# Patient Record
Sex: Male | Born: 1980
Health system: Southern US, Community
[De-identification: ages and names within clinical notes are randomized; demographics above are authoritative.]

## PROBLEM LIST (undated history)

## (undated) DIAGNOSIS — K219 Gastro-esophageal reflux disease without esophagitis: Secondary | ICD-10-CM

## (undated) DIAGNOSIS — K900 Celiac disease: Secondary | ICD-10-CM

## (undated) DIAGNOSIS — T7840XA Allergy, unspecified, initial encounter: Secondary | ICD-10-CM

## (undated) HISTORY — DX: Gastro-esophageal reflux disease without esophagitis: K21.9

## (undated) HISTORY — DX: Celiac disease: K90.0

## (undated) HISTORY — PX: APPENDECTOMY: SHX54

## (undated) HISTORY — DX: Allergy, unspecified, initial encounter: T78.40XA

---
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
## 2009-06-26 HISTORY — PX: HERNIA REPAIR: SHX51

## 2015-07-06 ENCOUNTER — Encounter: Payer: Self-pay | Admitting: Family Medicine

## 2015-07-06 ENCOUNTER — Ambulatory Visit (INDEPENDENT_AMBULATORY_CARE_PROVIDER_SITE_OTHER): Payer: 59 | Admitting: Family Medicine

## 2015-07-06 VITALS — BP 123/80 | HR 60 | Temp 97.1°F | Ht 71.0 in | Wt 200.0 lb

## 2015-07-06 DIAGNOSIS — R109 Unspecified abdominal pain: Secondary | ICD-10-CM

## 2015-07-06 DIAGNOSIS — N10 Acute tubulo-interstitial nephritis: Secondary | ICD-10-CM

## 2015-07-06 LAB — POCT URINALYSIS DIPSTICK
BILIRUBIN UA: NEGATIVE
Glucose, UA: NEGATIVE
KETONES UA: NEGATIVE
Nitrite, UA: NEGATIVE
PROTEIN UA: NEGATIVE
Spec Grav, UA: 1.025
Urobilinogen, UA: NEGATIVE
pH, UA: 5

## 2015-07-06 LAB — POCT UA - MICROSCOPIC ONLY
BACTERIA, U MICROSCOPIC: NEGATIVE
CASTS, UR, LPF, POC: NEGATIVE
Crystals, Ur, HPF, POC: NEGATIVE
Yeast, UA: NEGATIVE

## 2015-07-06 MED ORDER — CEFTRIAXONE SODIUM 1 G IJ SOLR
1.0000 g | Freq: Once | INTRAMUSCULAR | Status: AC
  Administered 2015-07-06: 1 g via INTRAMUSCULAR

## 2015-07-06 MED ORDER — CIPROFLOXACIN HCL 500 MG PO TABS: 500.0000 mg | ORAL_TABLET | Freq: Two times a day (BID) | ORAL | Status: DC

## 2015-07-06 NOTE — Progress Notes (Signed)
   HPI  Patient presents today here to establish care and discuss abdominal pain.  He is a Oncologist in my practice.  He has a known history of celiac disease. For last 4 days he's continued to have his usual epigastric pain, however he's developed much more severe radiation of the pain to his bilateral flanks and down into his groin.  He denies dysuria, or any reason that he can think of that he would have a UTI. He has a family history of PKD, his father has it.  States he has a colicky sharp flank pain radiating to the groin.  Denies diarrhea, hematochezia, alcohol use, smoking, and Tylenol use.  He's been avoiding Motrin due to concerns that this could possibly be PUD  PMH: Smoking status noted His past medical, surgical, social, family history reviewed and updated in EMR ROS: Per HPI  Objective: BP 123/80 mmHg  Pulse 60  Temp(Src) 97.1 F (36.2 C) (Oral)  Ht '5\' 11"'$  (1.803 m)  Wt 200 lb (90.719 kg)  BMI 27.91 kg/m2 Gen: NAD, alert, cooperative with exam HEENT: NCAT, TMs normal bilaterally, oropharynx clear, nares clear with mild erythema CV: RRR, good S1/S2, no murmur Resp: CTABL, no wheezes, non-labored Abd: Normal bowel sounds, diffuse mild tenderness to palpation, CVA soreness with tapping but no reproduction of symptoms on the left Ext: No edema, warm Neuro: Alert and oriented, No gross deficits  Urinalysis with moderate blood and moderate leukocyte esterase, 15-20 WBCs per high-power field, 1-5 RBCs per high-power field   Assessment and plan:  # UTI, acute pyelonephritis Considering his back pain and severe systemic symptoms although ahead and treat this pyelonephritis 1 g of Rocephin +500 mg of Cipro twice daily 7 days Discussed possibility of prostatitis and he will follow-up if he has resumption of symptoms after medication is finished. Also discussed concerning family history of PKD, low threshold for ultrasound. Consider follow up UA in 3-4 weeks  to ensure resolution of hematuria  Orders Placed This Encounter  Procedures  . Urine culture  . CBC with Differential  . CMP14+EGFR  . Lipase  . POCT urinalysis dipstick  . POCT UA - Microscopic Only    Meds ordered this encounter  Medications  . omeprazole (PRILOSEC) 20 MG capsule    Sig: Take 20 mg by mouth 2 (two) times daily.  . ranitidine (ZANTAC) 150 MG tablet    Sig: Take 150 mg by mouth 2 (two) times daily.  . ciprofloxacin (CIPRO) 500 MG tablet    Sig: Take 1 tablet (500 mg total) by mouth 2 (two) times daily.    Dispense:  14 tablet    Refill:  0    Laroy Apple, MD [facility] 07/06/2015, 3:42 PM

## 2015-07-07 LAB — CBC WITH DIFFERENTIAL/PLATELET
BASOS: 0 %
Basophils Absolute: 0.1 10*3/uL (ref 0.0–0.2)
EOS (ABSOLUTE): 0.3 10*3/uL (ref 0.0–0.4)
EOS: 3 %
HEMATOCRIT: 37.2 % — AB (ref 37.5–51.0)
HEMOGLOBIN: 12.8 g/dL (ref 12.6–17.7)
IMMATURE GRANULOCYTES: 0 %
Immature Grans (Abs): 0 10*3/uL (ref 0.0–0.1)
LYMPHS ABS: 2.3 10*3/uL (ref 0.7–3.1)
LYMPHS: 21 %
MCH: 30.6 pg (ref 26.6–33.0)
MCHC: 34.4 g/dL (ref 31.5–35.7)
MCV: 89 fL (ref 79–97)
MONOCYTES: 8 %
MONOS ABS: 0.9 10*3/uL (ref 0.1–0.9)
Neutrophils Absolute: 7.7 10*3/uL — ABNORMAL HIGH (ref 1.4–7.0)
Neutrophils: 68 %
Platelets: 403 10*3/uL — ABNORMAL HIGH (ref 150–379)
RBC: 4.18 x10E6/uL (ref 4.14–5.80)
RDW: 12.7 % (ref 12.3–15.4)
WBC: 11.4 10*3/uL — AB (ref 3.4–10.8)

## 2015-07-07 LAB — CMP14+EGFR
A/G RATIO: 1.2 (ref 1.1–2.5)
ALBUMIN: 4.2 g/dL (ref 3.5–5.5)
ALT: 27 IU/L (ref 0–44)
AST: 17 IU/L (ref 0–40)
Alkaline Phosphatase: 75 IU/L (ref 39–117)
BUN/Creatinine Ratio: 13 (ref 8–19)
BUN: 12 mg/dL (ref 6–20)
Bilirubin Total: 0.3 mg/dL (ref 0.0–1.2)
CALCIUM: 9.3 mg/dL (ref 8.7–10.2)
CO2: 26 mmol/L (ref 18–29)
CREATININE: 0.93 mg/dL (ref 0.76–1.27)
Chloride: 99 mmol/L (ref 96–106)
GFR calc Af Amer: 125 mL/min/{1.73_m2} (ref >59)
GFR, EST NON AFRICAN AMERICAN: 108 mL/min/{1.73_m2} (ref >59)
GLOBULIN, TOTAL: 3.4 g/dL (ref 1.5–4.5)
Glucose: 95 mg/dL (ref 65–99)
Potassium: 4 mmol/L (ref 3.5–5.2)
Sodium: 141 mmol/L (ref 134–144)
Total Protein: 7.6 g/dL (ref 6.0–8.5)

## 2015-07-07 LAB — LIPASE: LIPASE: 35 U/L (ref 0–59)

## 2015-07-08 LAB — URINE CULTURE

## 2015-08-03 ENCOUNTER — Encounter: Payer: Self-pay | Admitting: Family Medicine

## 2015-08-03 ENCOUNTER — Ambulatory Visit (INDEPENDENT_AMBULATORY_CARE_PROVIDER_SITE_OTHER): Payer: 59 | Admitting: Family Medicine

## 2015-08-03 VITALS — BP 108/70 | HR 52 | Temp 97.1°F | Ht 71.0 in | Wt 194.8 lb

## 2015-08-03 DIAGNOSIS — J029 Acute pharyngitis, unspecified: Secondary | ICD-10-CM

## 2015-08-03 DIAGNOSIS — N390 Urinary tract infection, site not specified: Secondary | ICD-10-CM

## 2015-08-03 DIAGNOSIS — Z8271 Family history of polycystic kidney: Secondary | ICD-10-CM | POA: Insufficient documentation

## 2015-08-03 LAB — POCT UA - MICROSCOPIC ONLY
BACTERIA, U MICROSCOPIC: NEGATIVE
CASTS, UR, LPF, POC: NEGATIVE
CRYSTALS, UR, HPF, POC: NEGATIVE
Epithelial cells, urine per micros: NEGATIVE
RBC, urine, microscopic: NEGATIVE
WBC, Ur, HPF, POC: NEGATIVE
YEAST UA: NEGATIVE

## 2015-08-03 LAB — POCT URINALYSIS DIPSTICK
Blood, UA: NEGATIVE
GLUCOSE UA: NEGATIVE
LEUKOCYTES UA: NEGATIVE
NITRITE UA: NEGATIVE
Spec Grav, UA: >=1.03
Urobilinogen, UA: NEGATIVE
pH, UA: 6

## 2015-08-03 LAB — POCT RAPID STREP A (OFFICE): Rapid Strep A Screen: NEGATIVE

## 2015-08-03 NOTE — Progress Notes (Signed)
   HPI  Patient presents today here for follow-up of urinary tract infection.  UTI Patient has had some intermittent slight return of symptoms with occasional flank pain and occasional groin pain. This is consistently and occasionally he has similar pain with ceease.  He is concerned as his father has a history of polycystic kidney disease, he is not sure if its autosomal dominant No fevers, chills, sweats, abdominal pain, or discolored urine. He did have dysuria towards the end of his course of ciprofloxacin but this has resolved.  Sore throat. One day of sore throat, no cough or malaise.   PMH: Smoking status noted ROS: Per HPI  Objective: BP 108/70 mmHg  Pulse 52  Temp(Src) 97.1 F (36.2 C) (Oral)  Ht  (1.803 m)  Wt 194 lb 12.8 oz (88.361 kg)  BMI 27.18 kg/m2 Gen: NAD, alert, cooperative with exam HEENT: NCAT, slight tonsillar hypertrophy, mild erythema, no exudates, TMs normal bilaterally CV: RRR, good S1/S2, no murmur Resp: CTABL, no wheezes, non-labored Abd: SNTND, BS present, no guarding or organomegaly, no CVA tenderness Neuro: Alert and oriented, No gross deficits  Assessment and plan:  # urinary tract infection Urinalysis is reassuring today Considering family history of polycystic kidney disease will proceed ultrasound of the kidneys  # Sore throat Rapid strep neg Culture pending   Orders Placed This Encounter  Procedures  . Culture, Group A Strep    Order Specific Question:  Source    Answer:  throat  . US Renal    Standing Status: Future     Number of Occurrences:      Standing Expiration Date: 09/30/2016    Order Specific Question:  Reason for Exam (SYMPTOM  OR DIAGNOSIS REQUIRED)    Answer:  unexplained UTI, family Hx of  PKD    Order Specific Question:  Preferred imaging location?    Answer:  [facility]  . POCT urinalysis dipstick  . POCT UA - Microscopic Only  . POCT rapid strep A     Murtis Sink, MD [facility] 08/03/2015, 2:48 PM

## 2015-08-05 LAB — CULTURE, GROUP A STREP: Strep A Culture: NEGATIVE

## 2015-08-10 ENCOUNTER — Ambulatory Visit
Admission: RE | Admit: 2015-08-10 | Discharge: 2015-08-10 | Disposition: A | Discharge status: Home / Self Care | Payer: 59 | Source: Ambulatory Visit | Attending: Family Medicine | Admitting: Family Medicine

## 2015-08-10 DIAGNOSIS — N390 Urinary tract infection, site not specified: Secondary | ICD-10-CM

## 2015-08-10 DIAGNOSIS — Z8271 Family history of polycystic kidney: Secondary | ICD-10-CM

## 2015-09-27 ENCOUNTER — Encounter: Payer: Self-pay | Admitting: *Deleted

## 2015-09-27 ENCOUNTER — Encounter (INDEPENDENT_AMBULATORY_CARE_PROVIDER_SITE_OTHER): Payer: Self-pay

## 2015-11-18 ENCOUNTER — Other Ambulatory Visit: Payer: Self-pay | Admitting: Family

## 2015-11-18 MED ORDER — SUCRALFATE 1 GM/10ML PO SUSP: 1.0000 g | Freq: Three times a day (TID) | ORAL | Status: DC

## 2015-12-09 ENCOUNTER — Ambulatory Visit (INDEPENDENT_AMBULATORY_CARE_PROVIDER_SITE_OTHER): Payer: 59 | Admitting: Family Medicine

## 2015-12-09 ENCOUNTER — Encounter: Payer: Self-pay | Admitting: Family Medicine

## 2015-12-09 VITALS — BP 105/63 | HR 47 | Temp 97.4°F | Ht 71.0 in | Wt 189.0 lb

## 2015-12-09 DIAGNOSIS — K900 Celiac disease: Secondary | ICD-10-CM

## 2015-12-09 DIAGNOSIS — Z0000 Encounter for general adult medical examination without abnormal findings: Secondary | ICD-10-CM | POA: Diagnosis not present

## 2015-12-09 NOTE — Patient Instructions (Signed)
Continue current medications. Continue good therapeutic lifestyle changes which include good diet and exercise. Fall precautions discussed with patient. If an FOBT was given today- please return it to our front desk. If you are over 33 years old - you may need Prevnar 13 or the adult Pneumonia vaccine.   After your visit with us today you will receive a survey in the mail or online from [employer] regarding your care with us. Please take a moment to fill this out. Your feedback is very important to us as you can help us better understand your patient needs as well as improve your experience and satisfaction. WE CARE about you!

## 2015-12-09 NOTE — Progress Notes (Signed)
   HPI  Patient presents today here for a physical exam.  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  Patient feels well and is in good health.  He has celiac disease and had a recent dietary indiscretion resulting in diarrhea and dyspepsia. He has also recently had an elevated PSA, we have plans to repeat the labs in 3 months after collection, about 2 months from now.  He denies any nocturia, urinary hesitancy, or other symptoms of BPH.  He exercises playing soccer, seasonally. He watches his diet closely, gluten-free diets tend to be lower in carbohydrates and sugars  PMH: Smoking status noted Family history positive for depression and Arnold-Chiari malformation and sister Mother with thyroid cancer Status post appendectomy and hernia repair Never smoker  ROS: Per HPI  Objective: BP 105/63 mmHg  Pulse 47  Temp(Src) 97.4 F (36.3 C) (Oral)  Ht 5\' 11"  (1.803 m)  Wt 189 lb (85.73 kg)  BMI 26.37 kg/m2 Gen: NAD, alert, cooperative with exam HEENT: NCAT, EOMI, PERRL CV: RRR, good S1/S2, no murmur Resp: CTABL, no wheezes, non-labored Abd: SNTND, BS present, no guarding or organomegaly Ext: No edema, warm Neuro: Alert and oriented, No gross deficits  Assessment and plan:  # Annual physical exam Normal exam Discussed age-appropriate diet exercise recommendations  # Elevated PSA Plan repeat labs in about 2 months.  # Celiac disease Patient doing well with diet    Murtis SinkSam Everet Flagg, MD [facility] 12/09/2015, 5:08 PM

## 2016-02-15 ENCOUNTER — Other Ambulatory Visit: Payer: Self-pay | Admitting: Family Medicine

## 2016-02-15 ENCOUNTER — Ambulatory Visit (INDEPENDENT_AMBULATORY_CARE_PROVIDER_SITE_OTHER): Payer: 59

## 2016-02-15 DIAGNOSIS — M79671 Pain in right foot: Secondary | ICD-10-CM

## 2016-02-15 NOTE — Progress Notes (Signed)
R foot injury with  persistent pain for 2 weeks, exacerbated by another injury 2-3 days ago. X-ray ordered.  Murtis SinkSam Dim Meisinger, MD [facility] 02/15/2016, 7:50 AM

## 2016-03-21 ENCOUNTER — Other Ambulatory Visit: Payer: Self-pay | Admitting: Family Medicine

## 2016-03-21 MED ORDER — ALBUTEROL SULFATE HFA 108 (90 BASE) MCG/ACT IN AERS: 2.0000 | INHALATION_SPRAY | Freq: Four times a day (QID) | RESPIRATORY_TRACT | 1 refills | Status: DC | PRN

## 2016-03-21 NOTE — Telephone Encounter (Signed)
Pt with cough and wheezing, concern for bronchitis.   Albuterol sent in.   Murtis SinkSam Bradshaw, MD [facility] 03/21/2016, 12:53 PM

## 2016-04-05 ENCOUNTER — Other Ambulatory Visit: Payer: Self-pay

## 2016-04-05 MED ORDER — PREDNISONE 10 MG PO TABS: 10.0000 mg | ORAL_TABLET | ORAL | 0 refills | Status: AC

## 2016-04-05 MED ORDER — AZITHROMYCIN 250 MG PO TABS: ORAL_TABLET | ORAL | 0 refills | Status: DC

## 2016-04-18 DIAGNOSIS — Z3009 Encounter for other general counseling and advice on contraception: Secondary | ICD-10-CM | POA: Diagnosis not present

## 2016-04-27 DIAGNOSIS — Z302 Encounter for sterilization: Secondary | ICD-10-CM | POA: Diagnosis not present

## 2016-06-14 ENCOUNTER — Other Ambulatory Visit: Payer: Self-pay | Admitting: Family

## 2016-06-14 MED ORDER — OSELTAMIVIR PHOSPHATE 75 MG PO CAPS: 75.0000 mg | ORAL_CAPSULE | Freq: Every day | ORAL | 0 refills | Status: DC

## 2016-06-26 HISTORY — PX: VASECTOMY: SHX75

## 2017-05-31 ENCOUNTER — Other Ambulatory Visit: Payer: Self-pay | Admitting: Family

## 2017-05-31 MED ORDER — AMOXICILLIN 500 MG PO TABS: 500.0000 mg | ORAL_TABLET | Freq: Two times a day (BID) | ORAL | 0 refills | Status: DC

## 2017-11-26 ENCOUNTER — Encounter: Payer: Self-pay | Admitting: Family Medicine

## 2017-11-26 ENCOUNTER — Ambulatory Visit (INDEPENDENT_AMBULATORY_CARE_PROVIDER_SITE_OTHER): Payer: 59 | Admitting: Family Medicine

## 2017-11-26 VITALS — BP 121/76 | HR 51 | Temp 97.1°F | Ht 71.0 in | Wt 199.8 lb

## 2017-11-26 DIAGNOSIS — Z0000 Encounter for general adult medical examination without abnormal findings: Secondary | ICD-10-CM

## 2017-11-26 NOTE — Patient Instructions (Signed)
Great to see you!  I can recommend Tana ConchStephen Hunter or Jacquiline Doealeb Parker as very trustworthy PCPs   Health Maintenance, Male A healthy lifestyle and preventive care is important for your health and wellness. Ask your health care provider about what schedule of regular examinations is right for you. What should I know about weight and diet? Eat a Healthy Diet  Eat plenty of vegetables, fruits, whole grains, low-fat dairy products, and lean protein.  Do not eat a lot of foods high in solid fats, added sugars, or salt.  Maintain a Healthy Weight Regular exercise can help you achieve or maintain a healthy weight. You should:  Do at least 150 minutes of exercise each week. The exercise should increase your heart rate and make you sweat (moderate-intensity exercise).  Do strength-training exercises at least twice a week.  Watch Your Levels of Cholesterol and Blood Lipids  Have your blood tested for lipids and cholesterol every 5 years starting at 35 years of age. If you are at high risk for heart disease, you should start having your blood tested when you are 35 years old. You may need to have your cholesterol levels checked more often if: ? Your lipid or cholesterol levels are high. ? You are older than 35 years of age. ? You are at high risk for heart disease.  What should I know about cancer screening? Many types of cancers can be detected early and may often be prevented. Lung Cancer  You should be screened every year for lung cancer if: ? You are a current smoker who has smoked for at least 30 years. ? You are a former smoker who has quit within the past 15 years.  Talk to your health care provider about your screening options, when you should start screening, and how often you should be screened.  Colorectal Cancer  Routine colorectal cancer screening usually begins at 35 years of age and should be repeated every 5-10 years until you are 35 years old. You may need to be screened more  often if early forms of precancerous polyps or small growths are found. Your health care provider may recommend screening at an earlier age if you have risk factors for colon cancer.  Your health care provider may recommend using home test kits to check for hidden blood in the stool.  A small camera at the end of a tube can be used to examine your colon (sigmoidoscopy or colonoscopy). This checks for the earliest forms of colorectal cancer.  Prostate and Testicular Cancer  Depending on your age and overall health, your health care provider may do certain tests to screen for prostate and testicular cancer.  Talk to your health care provider about any symptoms or concerns you have about testicular or prostate cancer.  Skin Cancer  Check your skin from head to toe regularly.  Tell your health care provider about any new moles or changes in moles, especially if: ? There is a change in a mole's size, shape, or color. ? You have a mole that is larger than a pencil eraser.  Always use sunscreen. Apply sunscreen liberally and repeat throughout the day.  Protect yourself by wearing long sleeves, pants, a wide-brimmed hat, and sunglasses when outside.  What should I know about heart disease, diabetes, and high blood pressure?  If you are 6918-35 years of age, have your blood pressure checked every 3-5 years. If you are 35 years of age or older, have your blood pressure checked every year.  You should have your blood pressure measured twice-once when you are at a hospital or clinic, and once when you are not at a hospital or clinic. Record the average of the two measurements. To check your blood pressure when you are not at a hospital or clinic, you can use: ? An automated blood pressure machine at a pharmacy. ? A home blood pressure monitor.  Talk to your health care provider about your target blood pressure.  If you are between 10-37 years old, ask your health care provider if you should take  aspirin to prevent heart disease.  Have regular diabetes screenings by checking your fasting blood sugar level. ? If you are at a normal weight and have a low risk for diabetes, have this test once every three years after the age of 58. ? If you are overweight and have a high risk for diabetes, consider being tested at a younger age or more often.  A one-time screening for abdominal aortic aneurysm (AAA) by ultrasound is recommended for men aged 74-75 years who are current or former smokers. What should I know about preventing infection? Hepatitis B If you have a higher risk for hepatitis B, you should be screened for this virus. Talk with your health care provider to find out if you are at risk for hepatitis B infection. Hepatitis C Blood testing is recommended for:  Everyone born from [birth year] through 1963.  Anyone with known risk factors for hepatitis C.  Sexually Transmitted Diseases (STDs)  You should be screened each year for STDs including gonorrhea and chlamydia if: ? You are sexually active and are younger than 35 years of age. ? You are older than 35 years of age and your health care provider tells you that you are at risk for this type of infection. ? Your sexual activity has changed since you were last screened and you are at an increased risk for chlamydia or gonorrhea. Ask your health care provider if you are at risk.  Talk with your health care provider about whether you are at high risk of being infected with HIV. Your health care provider may recommend a prescription medicine to help prevent HIV infection.  What else can I do?  Schedule regular health, dental, and eye exams.  Stay current with your vaccines (immunizations).  Do not use any tobacco products, such as cigarettes, chewing tobacco, and e-cigarettes. If you need help quitting, ask your health care provider.  Limit alcohol intake to no more than 2 drinks per day. One drink equals 12 ounces of beer, 5 ounces of  wine, or 1 ounces of hard liquor.  Do not use street drugs.  Do not share needles.  Ask your health care provider for help if you need support or information about quitting drugs.  Tell your health care provider if you often feel depressed.  Tell your health care provider if you have ever been abused or do not feel safe at home. This information is not intended to replace advice given to you by your health care provider. Make sure you discuss any questions you have with your health care provider. Document Released: 12/09/2007 Document Revised: 02/09/2016 Document Reviewed: 03/16/2015 Elsevier Interactive Patient Education  [employer].

## 2017-11-26 NOTE — Progress Notes (Signed)
   HPI  Patient presents today for annual physical exam.  Patient feels well and has no complaints.  He exercises regularly playing soccer and being active around his house.  He is watching his diet carefully, he is gluten sensitive and has celiac disease which naturally leads to some dietary restriction.  He is recently had labs which looked good on review.  Including normal glucose, kidney function, liver enzymes, PSA, and lipid panel.  He had a very low triglyceride at 35  PMH: Smoking status noted ROS: Per HPI  Objective: BP 121/76   Pulse (!) 51   Temp (!) 97.1 F (36.2 C) (Oral)   Ht 5\' 11"  (1.803 m)   Wt 199 lb 12.8 oz (90.6 kg)   BMI 27.87 kg/m  Gen: NAD, alert, cooperative with exam HEENT: NCAT, EOMI, PERRL CV: RRR, good S1/S2, no murmur Resp: CTABL, no wheezes, non-labored Abd: SNTND, BS present, no guarding or organomegaly Ext: No edema, warm Neuro: Alert and oriented, No gross deficits  Assessment and plan:  #Annual physical exam Normal exam, no concerns Labs up-to-date     Murtis SinkSam Rosa Gambale, MD [facility] 11/26/2017, 2:38 PM

## 2018-08-06 ENCOUNTER — Ambulatory Visit: Payer: 59 | Admitting: Family Medicine

## 2018-08-06 ENCOUNTER — Encounter: Payer: Self-pay | Admitting: Family Medicine

## 2018-08-06 VITALS — BP 120/74 | HR 75 | Temp 97.9°F | Ht 71.0 in | Wt 191.8 lb

## 2018-08-06 DIAGNOSIS — Z6826 Body mass index (BMI) 26.0-26.9, adult: Secondary | ICD-10-CM

## 2018-08-06 DIAGNOSIS — Z0001 Encounter for general adult medical examination with abnormal findings: Secondary | ICD-10-CM

## 2018-08-06 DIAGNOSIS — K219 Gastro-esophageal reflux disease without esophagitis: Secondary | ICD-10-CM | POA: Diagnosis not present

## 2018-08-06 DIAGNOSIS — K900 Celiac disease: Secondary | ICD-10-CM

## 2018-08-06 DIAGNOSIS — J302 Other seasonal allergic rhinitis: Secondary | ICD-10-CM | POA: Diagnosis not present

## 2018-08-06 NOTE — Progress Notes (Signed)
Subjective:  Dean Cross is a 38 y.o. male who presents today for his annual comprehensive physical exam.    HPI:  He has no acute complaints today.   His stable, chronic medical conditions are outlined below:  # Seasonal Allergies - On zyrtec and flonase as needed and doing well  # GERD - On zantac as needed  # Celiac Disease - Diet controlled  Lifestyle Diet: Gluten free.  Exercise: Goes to gym and swims twice weekly. Likes soccer and basketball.   Depression screen PHQ 2/9 08/06/2018  Decreased Interest 0  Down, Depressed, Hopeless 0  PHQ - 2 Score 0    Health Maintenance Due  Topic Date Due  . HIV Screening  07/29/1997  . TETANUS/TDAP  07/29/2001     ROS: Per HPI, otherwise a complete review of systems was negative.   PMH:  The following were reviewed and entered/updated in epic: Past Medical History:  Diagnosis Date  . Allergy    celiac's and seasonal  . Celiac disease   . GERD (gastroesophageal reflux disease)    Patient Active Problem List   Diagnosis Date Noted  . GERD (gastroesophageal reflux disease) 08/06/2018  . Seasonal allergies 08/06/2018  . Celiac disease 12/09/2015  . Family history of polycystic kidney disease 08/03/2015   Past Surgical History:  Procedure Laterality Date  . APPENDECTOMY     1992  . FINGER AMPUTATION  2000  . HERNIA REPAIR  2011   b/l inguinal hernia   . VASECTOMY  2018    Family History  Problem Relation Age of Onset  . Cancer Mother        thyroid  . Hyperlipidemia Father   . Polycystic kidney disease Father   . Asthma Sister   . Depression Sister   . Celiac disease Sister   . Arthritis Sister   . Arnold-Chiari malformation Sister   . Depression Sister   . Heart disease Paternal Grandfather   . Hyperlipidemia Paternal Grandfather   . Lung cancer Maternal Grandmother   . Prostate cancer Neg Hx   . Colon cancer Neg Hx     Medications- reviewed and updated Current Outpatient Medications    Medication Sig Dispense Refill  . cetirizine (ZYRTEC) 10 MG tablet Take 10 mg by mouth daily.    . fluticasone (FLONASE) 50 MCG/ACT nasal spray Place into both nostrils daily.    . ranitidine (ZANTAC) 150 MG tablet Take 150 mg by mouth 2 (two) times daily.     No current facility-administered medications for this visit.     Allergies-reviewed and updated No Known Allergies  Social History   Socioeconomic History  . Marital status: Married    Spouse name: Not on file  . Number of children: Not on file  . Years of education: Not on file  . Highest education level: Not on file  Occupational History  . Not on file  Social Needs  . Financial resource strain: Not on file  . Food insecurity:    Worry: Not on file    Inability: Not on file  . Transportation needs:    Medical: Not on file    Non-medical: Not on file  Tobacco Use  . Smoking status: Never Smoker  . Smokeless tobacco: Never Used  Substance and Sexual Activity  . Alcohol use: No  . Drug use: No  . Sexual activity: Yes  Lifestyle  . Physical activity:    Days per week: Not on file    Minutes per  session: Not on file  . Stress: Not on file  Relationships  . Social connections:    Talks on phone: Not on file    Gets together: Not on file    Attends religious service: Not on file    Active member of club or organization: Not on file    Attends meetings of clubs or organizations: Not on file    Relationship status: Not on file  Other Topics Concern  . Not on file  Social History Narrative  . Not on file    Objective:  Physical Exam: BP 120/74 (BP Location: Left Arm, Patient Position: Sitting, Cuff Size: Normal)   Pulse 75   Temp 97.9 F (36.6 C) (Oral)   Ht 5\' 11"  (1.803 m)   Wt 191 lb 12.8 oz (87 kg)   SpO2 97%   BMI 26.75 kg/m   Body mass index is 26.75 kg/m. Wt Readings from Last 3 Encounters:  08/06/18 191 lb 12.8 oz (87 kg)  11/26/17 199 lb 12.8 oz (90.6 kg)  12/09/15 189 lb (85.7 kg)    Gen: NAD, resting comfortably HEENT: TMs normal bilaterally. OP clear. No thyromegaly noted.  CV: RRR with no murmurs appreciated Pulm: NWOB, CTAB with no crackles, wheezes, or rhonchi GI: Normal bowel sounds present. Soft, Nontender, Nondistended. MSK: no edema, cyanosis, or clubbing noted Skin: warm, dry Neuro: CN2-12 grossly intact. Strength 5/5 in upper and lower extremities. Reflexes symmetric and intact bilaterally.  Psych: Normal affect and thought content  Assessment/Plan:  GERD (gastroesophageal reflux disease) Stable.  Continue Zantac as needed.  Seasonal allergies Stable.  Continue Zyrtec and Flonase.  Celiac disease Well-controlled via diet.   Preventative Healthcare: Up-to-date on vaccines and screenings.  He will get blood work done via work later this year and send [place] a copy of the results.  Patient Counseling(The following topics were reviewed and/or handout was given):  -Nutrition: Stressed importance of moderation in sodium/caffeine intake, saturated fat and cholesterol, caloric balance, sufficient intake of fresh fruits, vegetables, and fiber.  -Stressed the importance of regular exercise.   -Substance Abuse: Discussed cessation/primary prevention of tobacco, alcohol, or other drug use; driving or other dangerous activities under the influence; availability of treatment for abuse.   -Injury prevention: Discussed safety belts, safety helmets, smoke detector, smoking near bedding or upholstery.   -Sexuality: Discussed sexually transmitted diseases, partner selection, use of condoms, avoidance of unintended pregnancy and contraceptive alternatives.   -Dental health: Discussed importance of regular tooth brushing, flossing, and dental visits.  -Health maintenance and immunizations reviewed. Please refer to Health maintenance section.  Return to care in 1 year for next preventative visit.   Katina Degree. Jimmey Ralph, MD 08/06/2018 9:51 AM

## 2018-08-06 NOTE — Assessment & Plan Note (Signed)
Well controlled via diet

## 2018-08-06 NOTE — Patient Instructions (Signed)
It was very nice to see you today!  Keep up the good work!  No changes today.  Come back to see me in 1 year for your next physical, or sooner as needed.   Take care, Dr Jerline Pain   Preventive Care 18-39 Years, Male Preventive care refers to lifestyle choices and visits with your health care provider that can promote health and wellness. What does preventive care include?   A yearly physical exam. This is also called an annual well check.  Dental exams once or twice a year.  Routine eye exams. Ask your health care provider how often you should have your eyes checked.  Personal lifestyle choices, including: ? Daily care of your teeth and gums. ? Regular physical activity. ? Eating a healthy diet. ? Avoiding tobacco and drug use. ? Limiting alcohol use. ? Practicing safe sex. What happens during an annual well check? The services and screenings done by your health care provider during your annual well check will depend on your age, overall health, lifestyle risk factors, and family history of disease. Counseling Your health care provider may ask you questions about your:  Alcohol use.  Tobacco use.  Drug use.  Emotional well-being.  Home and relationship well-being.  Sexual activity.  Eating habits.  Work and work Statistician. Screening You may have the following tests or measurements:  Height, weight, and BMI.  Blood pressure.  Lipid and cholesterol levels. These may be checked every 5 years starting at age 78.  Diabetes screening. This is done by checking your blood sugar (glucose) after you have not eaten for a while (fasting).  Skin check.  Hepatitis C blood test.  Hepatitis B blood test.  Sexually transmitted disease (STD) testing. Discuss your test results, treatment options, and if necessary, the need for more tests with your health care provider. Vaccines Your health care provider may recommend certain vaccines, such as:  Influenza vaccine.  This is recommended every year.  Tetanus, diphtheria, and acellular pertussis (Tdap, Td) vaccine. You may need a Td booster every 10 years.  Varicella vaccine. You may need this if you have not been vaccinated.  HPV vaccine. If you are 31 or younger, you may need three doses over 6 months.  Measles, mumps, and rubella (MMR) vaccine. You may need at least one dose of MMR.You may also need a second dose.  Pneumococcal 13-valent conjugate (PCV13) vaccine. You may need this if you have certain conditions and have not been vaccinated.  Pneumococcal polysaccharide (PPSV23) vaccine. You may need one or two doses if you smoke cigarettes or if you have certain conditions.  Meningococcal vaccine. One dose is recommended if you are age 76-21 years and a first-year college student living in a residence hall, or if you have one of several medical conditions. You may also need additional booster doses.  Hepatitis A vaccine. You may need this if you have certain conditions or if you travel or work in places where you may be exposed to hepatitis A.  Hepatitis B vaccine. You may need this if you have certain conditions or if you travel or work in places where you may be exposed to hepatitis B.  Haemophilus influenzae type b (Hib) vaccine. You may need this if you have certain risk factors. Talk to your health care provider about which screenings and vaccines you need and how often you need them. This information is not intended to replace advice given to you by your health care provider. Make sure you discuss  any questions you have with your health care provider. Document Released: 08/08/2001 Document Revised: 01/23/2017 Document Reviewed: 04/13/2015 Elsevier Interactive Patient Education  2019 [employer].

## 2018-08-06 NOTE — Assessment & Plan Note (Signed)
Stable.  Continue Zantac as needed.

## 2018-08-06 NOTE — Assessment & Plan Note (Signed)
Stable.  Continue Zyrtec and Flonase. 

## 2018-08-20 ENCOUNTER — Other Ambulatory Visit: Payer: Self-pay | Admitting: Family

## 2018-08-20 MED ORDER — OSELTAMIVIR PHOSPHATE 75 MG PO CAPS: 75.0000 mg | ORAL_CAPSULE | Freq: Every day | ORAL | 0 refills | Status: DC

## 2018-10-14 ENCOUNTER — Other Ambulatory Visit: Payer: Self-pay

## 2018-10-14 ENCOUNTER — Ambulatory Visit (INDEPENDENT_AMBULATORY_CARE_PROVIDER_SITE_OTHER): Payer: 59

## 2018-10-14 DIAGNOSIS — Z23 Encounter for immunization: Secondary | ICD-10-CM | POA: Diagnosis not present

## 2019-10-28 ENCOUNTER — Encounter: Payer: 59 | Admitting: Family Medicine

## 2019-10-28 DIAGNOSIS — Z0289 Encounter for other administrative examinations: Secondary | ICD-10-CM

## 2019-11-11 ENCOUNTER — Ambulatory Visit (INDEPENDENT_AMBULATORY_CARE_PROVIDER_SITE_OTHER): Payer: 59 | Admitting: Family Medicine

## 2019-11-11 ENCOUNTER — Encounter: Payer: Self-pay | Admitting: Family Medicine

## 2019-11-11 ENCOUNTER — Other Ambulatory Visit: Payer: Self-pay

## 2019-11-11 VITALS — BP 110/70 | HR 68 | Temp 98.1°F | Ht 71.0 in | Wt 204.8 lb

## 2019-11-11 DIAGNOSIS — K219 Gastro-esophageal reflux disease without esophagitis: Secondary | ICD-10-CM

## 2019-11-11 DIAGNOSIS — Z0001 Encounter for general adult medical examination with abnormal findings: Secondary | ICD-10-CM

## 2019-11-11 DIAGNOSIS — J302 Other seasonal allergic rhinitis: Secondary | ICD-10-CM

## 2019-11-11 LAB — COMPREHENSIVE METABOLIC PANEL
ALT: 18 U/L (ref 0–53)
AST: 16 U/L (ref 0–37)
Albumin: 4.6 g/dL (ref 3.5–5.2)
Alkaline Phosphatase: 50 U/L (ref 39–117)
BUN: 15 mg/dL (ref 6–23)
CO2: 30 mEq/L (ref 19–32)
Calcium: 9.3 mg/dL (ref 8.4–10.5)
Chloride: 103 mEq/L (ref 96–112)
Creatinine, Ser: 1.02 mg/dL (ref 0.40–1.50)
GFR: 82.05 mL/min (ref >60.00)
Glucose, Bld: 92 mg/dL (ref 70–99)
Potassium: 4.4 mEq/L (ref 3.5–5.1)
Sodium: 138 mEq/L (ref 135–145)
Total Bilirubin: 0.5 mg/dL (ref 0.2–1.2)
Total Protein: 7.3 g/dL (ref 6.0–8.3)

## 2019-11-11 LAB — CBC
HCT: 40.2 % (ref 39.0–52.0)
Hemoglobin: 13.7 g/dL (ref 13.0–17.0)
MCHC: 34.1 g/dL (ref 30.0–36.0)
MCV: 91.9 fl (ref 78.0–100.0)
Platelets: 282 10*3/uL (ref 150.0–400.0)
RBC: 4.37 Mil/uL (ref 4.22–5.81)
RDW: 13.1 % (ref 11.5–15.5)
WBC: 5.8 10*3/uL (ref 4.0–10.5)

## 2019-11-11 LAB — LIPID PANEL
Cholesterol: 162 mg/dL (ref 0–200)
HDL: 41.5 mg/dL (ref >39.00)
LDL Cholesterol: 112 mg/dL — ABNORMAL HIGH (ref 0–99)
NonHDL: 120.26
Total CHOL/HDL Ratio: 4
Triglycerides: 43 mg/dL (ref 0.0–149.0)
VLDL: 8.6 mg/dL (ref 0.0–40.0)

## 2019-11-11 NOTE — Patient Instructions (Signed)
It was very nice to see you today!  Let me know if you need a referral for your shoulder.  We will check labs today.  Come back in 1 year for your next checkup, or sooner if needed.  Take care, Dr Jerline Pain  Please try these tips to maintain a healthy lifestyle:   Eat at least 3 REAL meals and 1-2 snacks per day.  Aim for no more than 5 hours between eating.  If you eat breakfast, please do so within one hour of getting up.    Each meal should contain half fruits/vegetables, one quarter protein, and one quarter carbs (no bigger than a computer mouse)   Cut down on sweet beverages. This includes juice, soda, and sweet tea.     Drink at least 1 glass of water with each meal and aim for at least 8 glasses per day   Exercise at least 150 minutes every week.    Preventive Care 69-60 Years Old, Male Preventive care refers to lifestyle choices and visits with your health care provider that can promote health and wellness. This includes:  A yearly physical exam. This is also called an annual well check.  Regular dental and eye exams.  Immunizations.  Screening for certain conditions.  Healthy lifestyle choices, such as eating a healthy diet, getting regular exercise, not using drugs or products that contain nicotine and tobacco, and limiting alcohol use. What can I expect for my preventive care visit? Physical exam Your health care provider will check:  Height and weight. These may be used to calculate body mass index (BMI), which is a measurement that tells if you are at a healthy weight.  Heart rate and blood pressure.  Your skin for abnormal spots. Counseling Your health care provider may ask you questions about:  Alcohol, tobacco, and drug use.  Emotional well-being.  Home and relationship well-being.  Sexual activity.  Eating habits.  Work and work Statistician. What immunizations do I need?  Influenza (flu) vaccine  This is recommended every  year. Tetanus, diphtheria, and pertussis (Tdap) vaccine  You may need a Td booster every 10 years. Varicella (chickenpox) vaccine  You may need this vaccine if you have not already been vaccinated. Human papillomavirus (HPV) vaccine  If recommended by your health care provider, you may need three doses over 6 months. Measles, mumps, and rubella (MMR) vaccine  You may need at least one dose of MMR. You may also need a second dose. Meningococcal conjugate (MenACWY) vaccine  One dose is recommended if you are 70-85 years old and a Market researcher living in a residence hall, or if you have one of several medical conditions. You may also need additional booster doses. Pneumococcal conjugate (PCV13) vaccine  You may need this if you have certain conditions and were not previously vaccinated. Pneumococcal polysaccharide (PPSV23) vaccine  You may need one or two doses if you smoke cigarettes or if you have certain conditions. Hepatitis A vaccine  You may need this if you have certain conditions or if you travel or work in places where you may be exposed to hepatitis A. Hepatitis B vaccine  You may need this if you have certain conditions or if you travel or work in places where you may be exposed to hepatitis B. Haemophilus influenzae type b (Hib) vaccine  You may need this if you have certain risk factors. You may receive vaccines as individual doses or as more than one vaccine together in one shot (combination  vaccines). Talk with your health care provider about the risks and benefits of combination vaccines. What tests do I need? Blood tests  Lipid and cholesterol levels. These may be checked every 5 years starting at age 59.  Hepatitis C test.  Hepatitis B test. Screening   Diabetes screening. This is done by checking your blood sugar (glucose) after you have not eaten for a while (fasting).  Sexually transmitted disease (STD) testing. Talk with your health care  provider about your test results, treatment options, and if necessary, the need for more tests. Follow these instructions at home: Eating and drinking   Eat a diet that includes fresh fruits and vegetables, whole grains, lean protein, and low-fat dairy products.  Take vitamin and mineral supplements as recommended by your health care provider.  Do not drink alcohol if your health care provider tells you not to drink.  If you drink alcohol: ? Limit how much you have to 0-2 drinks a day. ? Be aware of how much alcohol is in your drink. In the [place] one drink equals one 12 oz bottle of beer (355 mL), one 5 oz glass of wine (148 mL), or one 1 oz glass of hard liquor (44 mL). Lifestyle  Take daily care of your teeth and gums.  Stay active. Exercise for at least 30 minutes on 5 or more days each week.  Do not use any products that contain nicotine or tobacco, such as cigarettes, e-cigarettes, and chewing tobacco. If you need help quitting, ask your health care provider.  If you are sexually active, practice safe sex. Use a condom or other form of protection to prevent STIs (sexually transmitted infections). What's next?  Go to your health care provider once a year for a well check visit.  Ask your health care provider how often you should have your eyes and teeth checked.  Stay up to date on all vaccines. This information is not intended to replace advice given to you by your health care provider. Make sure you discuss any questions you have with your health care provider. Document Revised: 06/06/2018 Document Reviewed: 06/06/2018 Elsevier Patient Education  2020 [employer].

## 2019-11-11 NOTE — Assessment & Plan Note (Signed)
Stable.  Continue Pepcid as needed daily.

## 2019-11-11 NOTE — Progress Notes (Signed)
Chief Complaint:  Dean Cross is a 39 y.o. male who presents today for his annual comprehensive physical exam.    Assessment/Plan:  New/Acute Problems: Shoulder Pain Possibly mild rotator cuff strain.  He will continue with conservative management.  He will let me know if he needs referral to PT is worsening.  Chronic Problems Addressed Today: Seasonal allergies Stable.  Continue Zyrtec and Flonase as needed.  GERD (gastroesophageal reflux disease) Stable.  Continue Pepcid as needed daily.   Body mass index is 28.56 kg/m. / Overweight BMI Metric Follow Up - 11/11/19 0832      BMI Metric Follow Up-Please document annually   BMI Metric Follow Up  Education provided        Preventative Healthcare: Check CBC, C met, TSH, lipid panel.  Patient Counseling(The following topics were reviewed and/or handout was given):  -Nutrition: Stressed importance of moderation in sodium/caffeine intake, saturated fat and cholesterol, caloric balance, sufficient intake of fresh fruits, vegetables, and fiber.  -Stressed the importance of regular exercise.   -Substance Abuse: Discussed cessation/primary prevention of tobacco, alcohol, or other drug use; driving or other dangerous activities under the influence; availability of treatment for abuse.   -Injury prevention: Discussed safety belts, safety helmets, smoke detector, smoking near bedding or upholstery.   -Sexuality: Discussed sexually transmitted diseases, partner selection, use of condoms, avoidance of unintended pregnancy and contraceptive alternatives.   -Dental health: Discussed importance of regular tooth brushing, flossing, and dental visits.  -Health maintenance and immunizations reviewed. Please refer to Health maintenance section.  Return to care in 1 year for next preventative visit.     Subjective:  HPI:  He has no acute complaints today.   Injured shoulder a few months ago while playing ultimate frisbee.  Has been  working on some home exercises with some modest improvement.  Initially took some ibuprofen but has not needed any anti-inflammatory since.  Symptoms are improving slowly.  Lifestyle Diet: Balanced. Plenty of fruits and vegetables.  Exercise: Likes to play soccer. Likes to go swimming.   Depression screen PHQ 2/9 08/06/2018  Decreased Interest 0  Down, Depressed, Hopeless 0  PHQ - 2 Score 0    Health Maintenance Due  Topic Date Due  . HIV Screening  Never done     ROS: Per HPI, otherwise a complete review of systems was negative.   PMH:  The following were reviewed and entered/updated in epic: Past Medical History:  Diagnosis Date  . Allergy    celiac's and seasonal  . Celiac disease   . GERD (gastroesophageal reflux disease)    Patient Active Problem List   Diagnosis Date Noted  . GERD (gastroesophageal reflux disease) 08/06/2018  . Seasonal allergies 08/06/2018  . Celiac disease 12/09/2015  . Family history of polycystic kidney disease 08/03/2015   Past Surgical History:  Procedure Laterality Date  . APPENDECTOMY     1992  . FINGER AMPUTATION  2000  . HERNIA REPAIR  2011   b/l inguinal hernia   . VASECTOMY  2018    Family History  Problem Relation Age of Onset  . Cancer Mother        thyroid  . Hyperlipidemia Father   . Polycystic kidney disease Father   . Asthma Sister   . Depression Sister   . Celiac disease Sister   . Arthritis Sister   . Arnold-Chiari malformation Sister   . Depression Sister   . Heart disease Paternal Grandfather   . Hyperlipidemia Paternal Grandfather   .  Lung cancer Maternal Grandmother   . Prostate cancer Neg Hx   . Colon cancer Neg Hx     Medications- reviewed and updated Current Outpatient Medications  Medication Sig Dispense Refill  . cetirizine (ZYRTEC) 10 MG tablet Take 10 mg by mouth daily.    . fluticasone (FLONASE) 50 MCG/ACT nasal spray Place into both nostrils daily.     No current facility-administered  medications for this visit.    Allergies-reviewed and updated No Known Allergies  Social History   Socioeconomic History  . Marital status: Married    Spouse name: Not on file  . Number of children: Not on file  . Years of education: Not on file  . Highest education level: Not on file  Occupational History  . Not on file  Tobacco Use  . Smoking status: Never Smoker  . Smokeless tobacco: Never Used  Substance and Sexual Activity  . Alcohol use: No  . Drug use: No  . Sexual activity: Yes  Other Topics Concern  . Not on file  Social History Narrative  . Not on file   Social Determinants of Health   Financial Resource Strain:   . Difficulty of Paying Living Expenses:   Food Insecurity:   . Worried About Charity fundraiser in the Last Year:   . Arboriculturist in the Last Year:   Transportation Needs:   . Film/video editor (Medical):   Marland Kitchen Lack of Transportation (Non-Medical):   Physical Activity:   . Days of Exercise per Week:   . Minutes of Exercise per Session:   Stress:   . Feeling of Stress :   Social Connections:   . Frequency of Communication with Friends and Family:   . Frequency of Social Gatherings with Friends and Family:   . Attends Religious Services:   . Active Member of Clubs or Organizations:   . Attends Archivist Meetings:   Marland Kitchen Marital Status:         Objective:  Physical Exam: BP 110/70 (BP Location: Left Arm, Patient Position: Sitting, Cuff Size: Normal)   Pulse 68   Temp 98.1 F (36.7 C) (Temporal)   Ht 5' 11" (1.803 m)   Wt 204 lb 12.8 oz (92.9 kg)   SpO2 98%   BMI 28.56 kg/m   Body mass index is 28.56 kg/m. Wt Readings from Last 3 Encounters:  11/11/19 204 lb 12.8 oz (92.9 kg)  08/06/18 191 lb 12.8 oz (87 kg)  11/26/17 199 lb 12.8 oz (90.6 kg)   Gen: NAD, resting comfortably HEENT: TMs normal bilaterally. OP clear. No thyromegaly noted.  CV: RRR with no murmurs appreciated Pulm: NWOB, CTAB with no crackles,  wheezes, or rhonchi GI: Normal bowel sounds present. Soft, Nontender, Nondistended. MSK: no edema, cyanosis, or clubbing noted Skin: warm, dry Neuro: CN2-12 grossly intact. Strength 5/5 in upper and lower extremities. Reflexes symmetric and intact bilaterally.  Psych: Normal affect and thought content     Valaree Fresquez M. Jerline Pain, MD 11/11/2019 8:33 AM

## 2019-11-11 NOTE — Assessment & Plan Note (Signed)
Stable.  Continue Zyrtec and Flonase as needed. 

## 2019-11-12 ENCOUNTER — Encounter: Payer: Self-pay | Admitting: Family Medicine

## 2019-11-12 DIAGNOSIS — E785 Hyperlipidemia, unspecified: Secondary | ICD-10-CM | POA: Insufficient documentation

## 2019-11-12 LAB — TSH: TSH: 2.2 u[IU]/mL (ref 0.35–4.50)

## 2019-11-12 NOTE — Progress Notes (Signed)
Please inform patient of the following:  LDL a bit elevated but everything is NORMAL. Would like for him to keep working on diet and exercise and we can recheck in a year or so.  Katina Degree. Jimmey Ralph, MD 11/12/2019 10:27 AM

## 2020-01-30 ENCOUNTER — Ambulatory Visit (INDEPENDENT_AMBULATORY_CARE_PROVIDER_SITE_OTHER): Payer: 59 | Admitting: Family

## 2020-01-30 ENCOUNTER — Encounter: Payer: Self-pay | Admitting: Family

## 2020-01-30 ENCOUNTER — Ambulatory Visit (INDEPENDENT_AMBULATORY_CARE_PROVIDER_SITE_OTHER): Payer: 59

## 2020-01-30 DIAGNOSIS — S99912A Unspecified injury of left ankle, initial encounter: Secondary | ICD-10-CM

## 2020-01-30 DIAGNOSIS — M25572 Pain in left ankle and joints of left foot: Secondary | ICD-10-CM | POA: Diagnosis not present

## 2020-01-30 DIAGNOSIS — W5689XA Other contact with other nonvenomous marine animals, initial encounter: Secondary | ICD-10-CM

## 2020-01-30 MED ORDER — PREDNISONE 10 MG (21) PO TBPK: ORAL_TABLET | ORAL | 0 refills | Status: DC

## 2020-01-30 NOTE — Progress Notes (Signed)
Subjective:    Patient ID: Dean Cross, male    DOB: [birth year], 39 y.o.   MRN: [id number]  Chief Complaint  Patient presents with  . Ankle Pain    left ankle injury sting wray     Ankle Pain    Pt presents to the office today with left ankle pain that started 01/22/20 after he was stung by a stingray. He reports his pain is intermittent sharp pains of 5-6 out 10, but a constant 1-2 pain. He has taken motrin with mild relief.   He reports his pain had improved, but over the last two days he has seem to worsen. He does admit to doing a lot of biking and swimming two days ago and is unsure if he flared it up.   He is able to walk normal, but has pain in posterior ankle.   Review of Systems  Skin: Positive for wound.  All other systems reviewed and are negative.      Objective:   Physical Exam Vitals reviewed.  Constitutional:      General: He is not in acute distress.    Appearance: He is well-developed.  HENT:     Head: Normocephalic.  Eyes:     General:        Right eye: No discharge.        Left eye: No discharge.     Pupils: Pupils are equal, round, and reactive to light.  Neck:     Thyroid: No thyromegaly.  Cardiovascular:     Rate and Rhythm: Normal rate and regular rhythm.     Heart sounds: Normal heart sounds. No murmur heard.   Pulmonary:     Effort: Pulmonary effort is normal. No respiratory distress.     Breath sounds: Normal breath sounds. No wheezing.  Abdominal:     General: Bowel sounds are normal. There is no distension.     Palpations: Abdomen is soft.     Tenderness: There is no abdominal tenderness.  Musculoskeletal:        General: No tenderness. Normal range of motion.     Cervical back: Normal range of motion and neck supple.  Skin:    General: Skin is warm and dry.     Findings: No erythema or rash.     Comments: Small puncture wound on posterior ankle, mild swelling and tenderness noted. No warmth or redness noted  Neurological:      Mental Status: He is alert and oriented to person, place, and time.     Cranial Nerves: No cranial nerve deficit.     Deep Tendon Reflexes: Reflexes are normal and symmetric.  Psychiatric:        Behavior: Behavior normal.        Thought Content: Thought content normal.        Judgment: Judgment normal.          There were no vitals taken for this visit.  Assessment & Plan:  Dean Cross comes in today with chief complaint of Ankle Pain (left ankle injury sting wray )   Diagnosis and orders addressed:  1. Injury of left ankle, initial encounter - DG Ankle Complete Left; Future  2. Contact with stingray as cause of accidental injury - predniSONE (STERAPRED UNI-PAK 21 TAB) 10 MG (21) TBPK tablet; Use as directed  Dispense: 21 tablet; Refill: 0   Start prednisone and Motrin as needed Take easy  Ice to help with swelling RTO if symptoms worsen or do not  improve   Jannifer Rodney, FNP

## 2020-06-15 ENCOUNTER — Other Ambulatory Visit: Payer: Self-pay | Admitting: Family

## 2020-06-15 MED ORDER — ONDANSETRON HCL 4 MG PO TABS: 4.0000 mg | ORAL_TABLET | Freq: Three times a day (TID) | ORAL | 0 refills | Status: DC | PRN

## 2020-06-15 NOTE — Progress Notes (Signed)
Pt calls with GI bug that started this AM. Will send in zofran.   Jannifer Rodney, FNP

## 2020-09-30 ENCOUNTER — Other Ambulatory Visit: Payer: 59

## 2020-09-30 LAB — LIPID PANEL
HDL: 46 (ref 35–70)
LDL Cholesterol: 84
Triglycerides: 45 (ref 40–160)

## 2020-09-30 LAB — CBC AND DIFFERENTIAL
HCT: 41 (ref 41–53)
Hemoglobin: 13.7 (ref 13.5–17.5)
Neutrophils Absolute: 2.6
Platelets: 290 (ref 150–399)
WBC: 4.7

## 2020-09-30 LAB — BASIC METABOLIC PANEL
BUN: 12 (ref 4–21)
Creatinine: 1.1 (ref 0.6–1.3)
Glucose: 100

## 2020-09-30 LAB — COMPREHENSIVE METABOLIC PANEL: GFR calc non Af Amer: 67

## 2020-09-30 LAB — HEMOGLOBIN A1C: Hemoglobin A1C: 5.4

## 2020-09-30 LAB — CBC: RBC: 4.46 (ref 3.87–5.11)

## 2020-10-01 LAB — TSH: TSH: 1.31 (ref 0.41–5.90)

## 2020-11-16 ENCOUNTER — Encounter: Payer: Self-pay | Admitting: Family Medicine

## 2020-11-16 ENCOUNTER — Other Ambulatory Visit: Payer: Self-pay

## 2020-11-16 ENCOUNTER — Ambulatory Visit (INDEPENDENT_AMBULATORY_CARE_PROVIDER_SITE_OTHER): Payer: 59 | Admitting: Family Medicine

## 2020-11-16 ENCOUNTER — Ambulatory Visit (INDEPENDENT_AMBULATORY_CARE_PROVIDER_SITE_OTHER): Payer: 59

## 2020-11-16 VITALS — BP 104/63 | HR 47 | Temp 97.7°F | Ht 71.0 in | Wt 202.4 lb

## 2020-11-16 DIAGNOSIS — E785 Hyperlipidemia, unspecified: Secondary | ICD-10-CM

## 2020-11-16 DIAGNOSIS — G8929 Other chronic pain: Secondary | ICD-10-CM | POA: Diagnosis not present

## 2020-11-16 DIAGNOSIS — Z0001 Encounter for general adult medical examination with abnormal findings: Secondary | ICD-10-CM

## 2020-11-16 DIAGNOSIS — M25511 Pain in right shoulder: Secondary | ICD-10-CM | POA: Diagnosis not present

## 2020-11-16 DIAGNOSIS — E663 Overweight: Secondary | ICD-10-CM | POA: Diagnosis not present

## 2020-11-16 DIAGNOSIS — Z6828 Body mass index (BMI) 28.0-28.9, adult: Secondary | ICD-10-CM

## 2020-11-16 NOTE — Progress Notes (Signed)
Chief Complaint:  Dean Cross is a 40 y.o. male who presents today for his annual comprehensive physical exam.    Assessment/Plan:  New/Acute Problems: Right shoulder pain Concern for rotator cuff tear.  He also has some tenderness over AC joint and may have an AC joint sprain as well.  No obvious abnormalities on x-ray per my read.  Will await radiology read.  He has follow-up with sports medicine in a couple weeks.  He will continue using over-the-counter meds as needed.  Chronic Problems Addressed Today: Dyslipidemia Last LDL at goal.   Body mass index is 28.23 kg/m. / Overweight  BMI Metric Follow Up - 11/16/20 1105      BMI Metric Follow Up-Please document annually   BMI Metric Follow Up Education provided            Preventative Healthcare: Recently had labs done through work.  Up-to-date on other vaccines and screenings.  Patient Counseling(The following topics were reviewed and/or handout was given):  -Nutrition: Stressed importance of moderation in sodium/caffeine intake, saturated fat and cholesterol, caloric balance, sufficient intake of fresh fruits, vegetables, and fiber.  -Stressed the importance of regular exercise.   -Substance Abuse: Discussed cessation/primary prevention of tobacco, alcohol, or other drug use; driving or other dangerous activities under the influence; availability of treatment for abuse.   -Injury prevention: Discussed safety belts, safety helmets, smoke detector, smoking near bedding or upholstery.   -Sexuality: Discussed sexually transmitted diseases, partner selection, use of condoms, avoidance of unintended pregnancy and contraceptive alternatives.   -Dental health: Discussed importance of regular tooth brushing, flossing, and dental visits.  -Health maintenance and immunizations reviewed. Please refer to Health maintenance section.  Return to care in 1 year for next preventative visit.     Subjective:  HPI:  He has no acute  complaints today.   Patient with right shoulder pain.  Started a few years ago after playing ultimate Frisbee.  Worked on some home rehab exercises.  Symptoms improved.  Worsened last few months.  3 days ago was playing soccer and fell and landed on his right elbow.  Had significant pain since then.  He has been using ibuprofen and TENS unit.  He has pain with certain motions.  Lifestyle Diet: Balanced Exercise: Likes to play soccer.  Depression screen PHQ 2/9 11/16/2020  Decreased Interest 0  Down, Depressed, Hopeless 0  PHQ - 2 Score 0    Health Maintenance Due  Topic Date Due  . HIV Screening  Never done  . Hepatitis C Screening  Never done  . COVID-19 Vaccine (3 - Booster for Pfizer series) 12/16/2019     ROS: Per HPI, otherwise a complete review of systems was negative.   PMH:  The following were reviewed and entered/updated in epic: Past Medical History:  Diagnosis Date  . Allergy    celiac's and seasonal  . Celiac disease   . GERD (gastroesophageal reflux disease)    Patient Active Problem List   Diagnosis Date Noted  . Dyslipidemia 11/12/2019  . GERD (gastroesophageal reflux disease) 08/06/2018  . Seasonal allergies 08/06/2018  . Celiac disease 12/09/2015  . Family history of polycystic kidney disease 08/03/2015   Past Surgical History:  Procedure Laterality Date  . APPENDECTOMY     1992  . FINGER AMPUTATION  2000  . HERNIA REPAIR  2011   b/l inguinal hernia   . VASECTOMY  2018    Family History  Problem Relation Age of Onset  . Cancer Mother  thyroid  . Hyperlipidemia Father   . Polycystic kidney disease Father   . Asthma Sister   . Depression Sister   . Celiac disease Sister   . Arthritis Sister   . Arnold-Chiari malformation Sister   . Depression Sister   . Heart disease Paternal Grandfather   . Hyperlipidemia Paternal Grandfather   . Lung cancer Maternal Grandmother   . Prostate cancer Neg Hx   . Colon cancer Neg Hx      Medications- reviewed and updated Current Outpatient Medications  Medication Sig Dispense Refill  . cetirizine (ZYRTEC) 10 MG tablet Take 10 mg by mouth daily.    . fluticasone (FLONASE) 50 MCG/ACT nasal spray Place into both nostrils daily.    Marland Kitchen PFIZER-BIONTECH COVID-19 VACC 30 MCG/0.3ML injection      No current facility-administered medications for this visit.    Allergies-reviewed and updated No Known Allergies  Social History   Socioeconomic History  . Marital status: Married    Spouse name: Not on file  . Number of children: Not on file  . Years of education: Not on file  . Highest education level: Not on file  Occupational History  . Not on file  Tobacco Use  . Smoking status: Never Smoker  . Smokeless tobacco: Never Used  Substance and Sexual Activity  . Alcohol use: No  . Drug use: No  . Sexual activity: Yes  Other Topics Concern  . Not on file  Social History Narrative  . Not on file   Social Determinants of Health   Financial Resource Strain: Not on file  Food Insecurity: Not on file  Transportation Needs: Not on file  Physical Activity: Not on file  Stress: Not on file  Social Connections: Not on file        Objective:  Physical Exam: BP 104/63   Pulse (!) 47   Temp 97.7 F (36.5 C) (Temporal)   Ht 5\' 11"  (1.803 m)   Wt 202 lb 6.4 oz (91.8 kg)   SpO2 99%   BMI 28.23 kg/m   Body mass index is 28.23 kg/m. Wt Readings from Last 3 Encounters:  11/16/20 202 lb 6.4 oz (91.8 kg)  11/11/19 204 lb 12.8 oz (92.9 kg)  08/06/18 191 lb 12.8 oz (87 kg)   Gen: NAD, resting comfortably HEENT: TMs normal bilaterally. OP clear. No thyromegaly noted.  CV: RRR with no murmurs appreciated Pulm: NWOB, CTAB with no crackles, wheezes, or rhonchi GI: Normal bowel sounds present. Soft, Nontender, Nondistended. MSK: no edema, cyanosis, or clubbing noted - Right Shoulder: Tenderness to palpation at [facility] joint.  Limited range of motion due to pain.  Pain  elicited with resisted supraspinatus testing.  Significant pain with resisted external rotation as well.  Neurovascular intact distally. Skin: warm, dry Neuro: CN2-12 grossly intact. Strength 5/5 in upper and lower extremities. Reflexes symmetric and intact bilaterally.  Psych: Normal affect and thought content     Demetric Dunnaway M. [facility], MD 11/16/2020 11:05 AM

## 2020-11-16 NOTE — Assessment & Plan Note (Signed)
Last LDL at goal.   

## 2020-11-16 NOTE — Patient Instructions (Signed)
It was very nice to see you today!  Keep up the good work with diet and exercise!  I will see you back in year your next physical.  Come back to see me sooner if needed.  Take care, Dr Jimmey Ralph  PLEASE NOTE:  If you had any lab tests please let us know if you have not heard back within a few days. You may see your results on mychart before we have a chance to review them but we will give you a call once they are reviewed by [place]. If we ordered any referrals today, please let us know if you have not heard from their office within the next week.   Please try these tips to maintain a healthy lifestyle:   Eat at least 3 REAL meals and 1-2 snacks per day.  Aim for no more than 5 hours between eating.  If you eat breakfast, please do so within one hour of getting up.    Each meal should contain half fruits/vegetables, one quarter protein, and one quarter carbs (no bigger than a computer mouse)   Cut down on sweet beverages. This includes juice, soda, and sweet tea.     Drink at least 1 glass of water with each meal and aim for at least 8 glasses per day   Exercise at least 150 minutes every week.    Preventive Care 81-17 Years Old, Male Preventive care refers to lifestyle choices and visits with your health care provider that can promote health and wellness. This includes:  A yearly physical exam. This is also called an annual wellness visit.  Regular dental and eye exams.  Immunizations.  Screening for certain conditions.  Healthy lifestyle choices, such as: ? Eating a healthy diet. ? Getting regular exercise. ? Not using drugs or products that contain nicotine and tobacco. ? Limiting alcohol use. What can I expect for my preventive care visit? Physical exam Your health care provider may check your:  Height and weight. These may be used to calculate your BMI (body mass index). BMI is a measurement that tells if you are at a healthy weight.  Heart rate and blood  pressure.  Body temperature.  Skin for abnormal spots. Counseling Your health care provider may ask you questions about your:  Past medical problems.  Family's medical history.  Alcohol, tobacco, and drug use.  Emotional well-being.  Home life and relationship well-being.  Sexual activity.  Diet, exercise, and sleep habits.  Work and work Astronomer.  Access to firearms. What immunizations do I need? Vaccines are usually given at various ages, according to a schedule. Your health care provider will recommend vaccines for you based on your age, medical history, and lifestyle or other factors, such as travel or where you work.   What tests do I need? Blood tests  Lipid and cholesterol levels. These may be checked every 5 years starting at age 28.  Hepatitis C test.  Hepatitis B test. Screening  Diabetes screening. This is done by checking your blood sugar (glucose) after you have not eaten for a while (fasting).  Genital exam to check for testicular cancer or hernias.  STD (sexually transmitted disease) testing, if you are at risk. Talk with your health care provider about your test results, treatment options, and if necessary, the need for more tests.   Follow these instructions at home: Eating and drinking  Eat a healthy diet that includes fresh fruits and vegetables, whole grains, lean protein, and low-fat dairy products.  Drink enough fluid to keep your urine pale yellow.  Take vitamin and mineral supplements as recommended by your health care provider.  Do not drink alcohol if your health care provider tells you not to drink.  If you drink alcohol: ? Limit how much you have to 0-2 drinks a day. ? Be aware of how much alcohol is in your drink. In the [place] one drink equals one 12 oz bottle of beer (355 mL), one 5 oz glass of wine (148 mL), or one 1 oz glass of hard liquor (44 mL).   Lifestyle  Take daily care of your teeth and gums. Brush your teeth  every morning and night with fluoride toothpaste. Floss one time each day.  Stay active. Exercise for at least 30 minutes 5 or more days each week.  Do not use any products that contain nicotine or tobacco, such as cigarettes, e-cigarettes, and chewing tobacco. If you need help quitting, ask your health care provider.  Do not use drugs.  If you are sexually active, practice safe sex. Use a condom or other form of protection to prevent STIs (sexually transmitted infections).  Find healthy ways to cope with stress, such as: ? Meditation, yoga, or listening to music. ? Journaling. ? Talking to a trusted person. ? Spending time with friends and family. Safety  Always wear your seat belt while driving or riding in a vehicle.  Do not drive: ? If you have been drinking alcohol. Do not ride with someone who has been drinking. ? When you are tired or distracted. ? While texting.  Wear a helmet and other protective equipment during sports activities.  If you have firearms in your house, make sure you follow all gun safety procedures.  Seek help if you have been physically or sexually abused. What's next?  Go to your health care provider once a year for an annual wellness visit.  Ask your health care provider how often you should have your eyes and teeth checked.  Stay up to date on all vaccines. This information is not intended to replace advice given to you by your health care provider. Make sure you discuss any questions you have with your health care provider. Document Revised: 02/26/2019 Document Reviewed: 06/06/2018 Elsevier Patient Education  2021 [employer].

## 2020-11-30 DIAGNOSIS — M9907 Segmental and somatic dysfunction of upper extremity: Secondary | ICD-10-CM | POA: Diagnosis not present

## 2020-11-30 DIAGNOSIS — M9902 Segmental and somatic dysfunction of thoracic region: Secondary | ICD-10-CM | POA: Diagnosis not present

## 2020-11-30 DIAGNOSIS — M25511 Pain in right shoulder: Secondary | ICD-10-CM | POA: Diagnosis not present

## 2020-11-30 DIAGNOSIS — M12511 Traumatic arthropathy, right shoulder: Secondary | ICD-10-CM | POA: Diagnosis not present

## 2020-11-30 DIAGNOSIS — M9908 Segmental and somatic dysfunction of rib cage: Secondary | ICD-10-CM | POA: Diagnosis not present

## 2020-12-08 ENCOUNTER — Encounter: Payer: Self-pay | Admitting: Family Medicine

## 2021-01-10 DIAGNOSIS — M9901 Segmental and somatic dysfunction of cervical region: Secondary | ICD-10-CM | POA: Diagnosis not present

## 2021-01-10 DIAGNOSIS — M25311 Other instability, right shoulder: Secondary | ICD-10-CM | POA: Diagnosis not present

## 2021-01-10 DIAGNOSIS — M9907 Segmental and somatic dysfunction of upper extremity: Secondary | ICD-10-CM | POA: Diagnosis not present

## 2021-01-10 DIAGNOSIS — M9902 Segmental and somatic dysfunction of thoracic region: Secondary | ICD-10-CM | POA: Diagnosis not present

## 2021-01-10 DIAGNOSIS — M25511 Pain in right shoulder: Secondary | ICD-10-CM | POA: Diagnosis not present

## 2021-02-15 DIAGNOSIS — M25511 Pain in right shoulder: Secondary | ICD-10-CM | POA: Diagnosis not present

## 2021-02-15 DIAGNOSIS — M9908 Segmental and somatic dysfunction of rib cage: Secondary | ICD-10-CM | POA: Diagnosis not present

## 2021-02-15 DIAGNOSIS — M13812 Other specified arthritis, left shoulder: Secondary | ICD-10-CM | POA: Diagnosis not present

## 2021-02-15 DIAGNOSIS — M9907 Segmental and somatic dysfunction of upper extremity: Secondary | ICD-10-CM | POA: Diagnosis not present

## 2021-02-15 DIAGNOSIS — M9902 Segmental and somatic dysfunction of thoracic region: Secondary | ICD-10-CM | POA: Diagnosis not present

## 2021-02-15 DIAGNOSIS — M9901 Segmental and somatic dysfunction of cervical region: Secondary | ICD-10-CM | POA: Diagnosis not present

## 2021-02-15 DIAGNOSIS — M9903 Segmental and somatic dysfunction of lumbar region: Secondary | ICD-10-CM | POA: Diagnosis not present

## 2021-03-29 DIAGNOSIS — M25311 Other instability, right shoulder: Secondary | ICD-10-CM | POA: Diagnosis not present

## 2021-03-29 DIAGNOSIS — M9901 Segmental and somatic dysfunction of cervical region: Secondary | ICD-10-CM | POA: Diagnosis not present

## 2021-03-29 DIAGNOSIS — M9902 Segmental and somatic dysfunction of thoracic region: Secondary | ICD-10-CM | POA: Diagnosis not present

## 2021-03-29 DIAGNOSIS — M25511 Pain in right shoulder: Secondary | ICD-10-CM | POA: Diagnosis not present

## 2021-03-29 DIAGNOSIS — M9903 Segmental and somatic dysfunction of lumbar region: Secondary | ICD-10-CM | POA: Diagnosis not present

## 2021-03-29 DIAGNOSIS — M9908 Segmental and somatic dysfunction of rib cage: Secondary | ICD-10-CM | POA: Diagnosis not present

## 2021-03-29 DIAGNOSIS — M9907 Segmental and somatic dysfunction of upper extremity: Secondary | ICD-10-CM | POA: Diagnosis not present

## 2021-05-26 ENCOUNTER — Other Ambulatory Visit: Payer: Self-pay | Admitting: Family Medicine

## 2021-05-26 ENCOUNTER — Encounter: Payer: Self-pay | Admitting: Family

## 2021-05-26 ENCOUNTER — Ambulatory Visit (INDEPENDENT_AMBULATORY_CARE_PROVIDER_SITE_OTHER): Payer: 59 | Admitting: Family

## 2021-05-26 VITALS — BP 121/80 | HR 59 | Temp 97.8°F

## 2021-05-26 DIAGNOSIS — J029 Acute pharyngitis, unspecified: Secondary | ICD-10-CM | POA: Diagnosis not present

## 2021-05-26 LAB — RAPID STREP SCREEN (MED CTR MEBANE ONLY): Strep Gp A Ag, IA W/Reflex: NEGATIVE

## 2021-05-26 LAB — CULTURE, GROUP A STREP

## 2021-05-26 MED ORDER — AMOXICILLIN 500 MG PO CAPS: 500.0000 mg | ORAL_CAPSULE | Freq: Two times a day (BID) | ORAL | 0 refills | Status: AC

## 2021-05-26 NOTE — Progress Notes (Signed)
Subjective:    Patient ID: Dean Cross, male    DOB: December 07, 1980, 40 y.o.   MRN: [id number]  No chief complaint on file.  Sore Throat  This is a new problem. The current episode started 1 to 4 weeks ago. The problem has been gradually worsening. There has been no fever. The pain is at a severity of 5/10. The pain is mild. Associated symptoms include headaches, swollen glands and trouble swallowing. Pertinent negatives include no congestion, coughing or ear pain. He has had no exposure to strep. He has tried NSAIDs and acetaminophen for the symptoms. The treatment provided mild relief.     Review of Systems  HENT:  Positive for trouble swallowing. Negative for congestion and ear pain.   Respiratory:  Negative for cough.   Neurological:  Positive for headaches.  All other systems reviewed and are negative.     Objective:   Physical Exam Vitals reviewed.  Constitutional:      General: He is not in acute distress.    Appearance: He is well-developed.  HENT:     Head: Normocephalic.     Right Ear: Tympanic membrane is erythematous.     Left Ear: A middle ear effusion is present.     Mouth/Throat:     Pharynx: Posterior oropharyngeal erythema and uvula swelling present.  Eyes:     General:        Right eye: No discharge.        Left eye: No discharge.     Pupils: Pupils are equal, round, and reactive to light.  Neck:     Thyroid: No thyromegaly.  Cardiovascular:     Rate and Rhythm: Normal rate and regular rhythm.     Heart sounds: Normal heart sounds. No murmur heard. Pulmonary:     Effort: Pulmonary effort is normal. No respiratory distress.     Breath sounds: Normal breath sounds. No wheezing.  Abdominal:     General: Bowel sounds are normal. There is no distension.     Palpations: Abdomen is soft.     Tenderness: There is no abdominal tenderness.  Musculoskeletal:        General: No tenderness. Normal range of motion.     Cervical back: Normal range of motion and  neck supple.  Lymphadenopathy:     Cervical: Cervical adenopathy present.  Skin:    General: Skin is warm and dry.     Findings: No erythema or rash.  Neurological:     Mental Status: He is alert and oriented to person, place, and time.     Cranial Nerves: No cranial nerve deficit.     Deep Tendon Reflexes: Reflexes are normal and symmetric.  Psychiatric:        Behavior: Behavior normal.        Thought Content: Thought content normal.        Judgment: Judgment normal.     BP 121/80   Pulse (!) 59   Temp 97.8 F (36.6 C) (Temporal)       Assessment & Plan:  Dean Cross comes in today with chief complaint of No chief complaint on file.   Diagnosis and orders addressed:  1. Sore throat - Rapid Strep Screen ([facility] ONLY)  2. Acute pharyngitis, unspecified etiology - Take meds as prescribed - Use a cool mist humidifier  -Use saline nose sprays frequently -Force fluids -For any cough or congestion  Use plain Mucinex- regular strength or max strength is fine -For fever  or aces or pains- take tylenol or ibuprofen. -Throat lozenges if help -New toothbrush in 3 days - amoxicillin (AMOXIL) 500 MG capsule; Take 1 capsule (500 mg total) by mouth 2 (two) times daily for 10 days.  Dispense: 20 capsule; Refill: 0   Jannifer Rodney, FNP

## 2021-05-26 NOTE — Patient Instructions (Signed)
Pharyngitis °Pharyngitis is inflammation of the throat (pharynx). It is a very common cause of sore throat. Pharyngitis can be caused by a bacteria, but it is usually caused by a virus. Most cases of pharyngitis get better on their own without treatment. °What are the causes? °This condition may be caused by: °Infection by viruses (viral). Viral pharyngitis spreads easily from person to person (is contagious) through coughing, sneezing, and sharing of personal items or utensils such as cups, forks, spoons, and toothbrushes. °Infection by bacteria (bacterial). Bacterial pharyngitis may be spread by touching the nose or face after coming in contact with the bacteria, or through close contact, such as kissing. °Allergies. Allergies can cause buildup of mucus in the throat (post-nasal drip), leading to inflammation and irritation. Allergies can also cause blocked nasal passages, forcing breathing through the mouth, which dries and irritates the throat. °What increases the risk? °You are more likely to develop this condition if: °You are 5-24 years old. °You are exposed to crowded environments such as daycare, school, or dormitory living. °You live in a cold climate. °You have a weakened disease-fighting (immune) system. °What are the signs or symptoms? °Symptoms of this condition vary by the cause. Common symptoms of this condition include: °Sore throat. °Fatigue. °Low-grade fever. °Stuffy nose (nasal congestion) and cough. °Headache. °Other symptoms may include: °Glands in the neck (lymph nodes) that are swollen. °Skin rashes. °Plaque-like film on the throat or tonsils. This is often a symptom of bacterial pharyngitis. °Vomiting. °Red, itchy eyes (conjunctivitis). °Loss of appetite. °Joint pain and muscle aches. °Enlarged tonsils. °How is this diagnosed? °This condition may be diagnosed based on your medical history and a physical exam. Your health care provider will ask you questions about your illness and your  symptoms. °A swab of your throat may be done to check for bacteria (rapid strep test). Other lab tests may also be done, depending on the suspected cause, but these are rare. °How is this treated? °Many times, treatment is not needed for this condition. Pharyngitis usually gets better in 3-4 days without treatment. °Bacterial pharyngitis may be treated with antibiotic medicines. °Follow these instructions at home: °Medicines °Take over-the-counter and prescription medicines only as told by your health care provider. °If you were prescribed an antibiotic medicine, take it as told by your health care provider. Do not stop taking the antibiotic even if you start to feel better. °Use throat sprays to soothe your throat as told by your health care provider. °Children can get pharyngitis. Do not give your child aspirin because of the association with Reye's syndrome. °Managing pain °To help with pain, try: °Sipping warm liquids, such as broth, herbal tea, or warm water. °Eating or drinking cold or frozen liquids, such as frozen ice pops. °Gargling with a mixture of salt and water 3-4 times a day or as needed. To make salt water, completely dissolve ½-1 tsp (3-6 g) of salt in 1 cup (237 mL) of warm water. °Sucking on hard candy or throat lozenges. °Putting a cool-mist humidifier in your bedroom at night to moisten the air. °Sitting in the bathroom with the door closed for 5-10 minutes while you run hot water in the shower. ° °General instructions ° °Do not use any products that contain nicotine or tobacco. These products include cigarettes, chewing tobacco, and vaping devices, such as e-cigarettes. If you need help quitting, ask your health care provider. °Rest as told by your health care provider. °Drink enough fluid to keep your urine pale yellow. °How   is this prevented? °To help prevent becoming infected or spreading infection: °Wash your hands often with soap and water for at least 20 seconds. If soap and water are not  available, use hand sanitizer. °Do not touch your eyes, nose, or mouth with unwashed hands, and wash hands after touching these areas. °Do not share cups or eating utensils. °Avoid close contact with people who are sick. °Contact a health care provider if: °You have large, tender lumps in your neck. °You have a rash. °You cough up green, yellow-brown, or bloody mucus. °Get help right away if: °Your neck becomes stiff. °You drool or are unable to swallow liquids. °You cannot drink or take medicines without vomiting. °You have severe pain that does not go away, even after you take medicine. °You have trouble breathing, and it is not caused by a stuffy nose. °You have new pain and swelling in your joints such as the knees, ankles, wrists, or elbows. °These symptoms may represent a serious problem that is an emergency. Do not wait to see if the symptoms will go away. Get medical help right away. Call your local emergency services (911 in the [place] Do not drive yourself to the hospital. °Summary °Pharyngitis is redness, pain, and swelling (inflammation) of the throat (pharynx). °While pharyngitis can be caused by a bacteria, the most common causes are viral. °Most cases of pharyngitis get better on their own without treatment. °Bacterial pharyngitis is treated with antibiotic medicines. °This information is not intended to replace advice given to you by your health care provider. Make sure you discuss any questions you have with your health care provider. °Document Revised: 09/08/2020 Document Reviewed: 09/08/2020 °Elsevier Patient Education © 2022 [employer] Inc[employer] ° °

## 2022-03-20 ENCOUNTER — Encounter: Payer: Self-pay | Admitting: *Deleted

## 2022-05-13 IMAGING — DX DG SHOULDER 2+V*R*
3 series · 3 of 3 positions shown · non-contrast
Comparison: None.

CLINICAL DATA: Chronic right shoulder pain.

EXAM:
RIGHT SHOULDER - 2+ VIEW

[shoulder grashey ap]
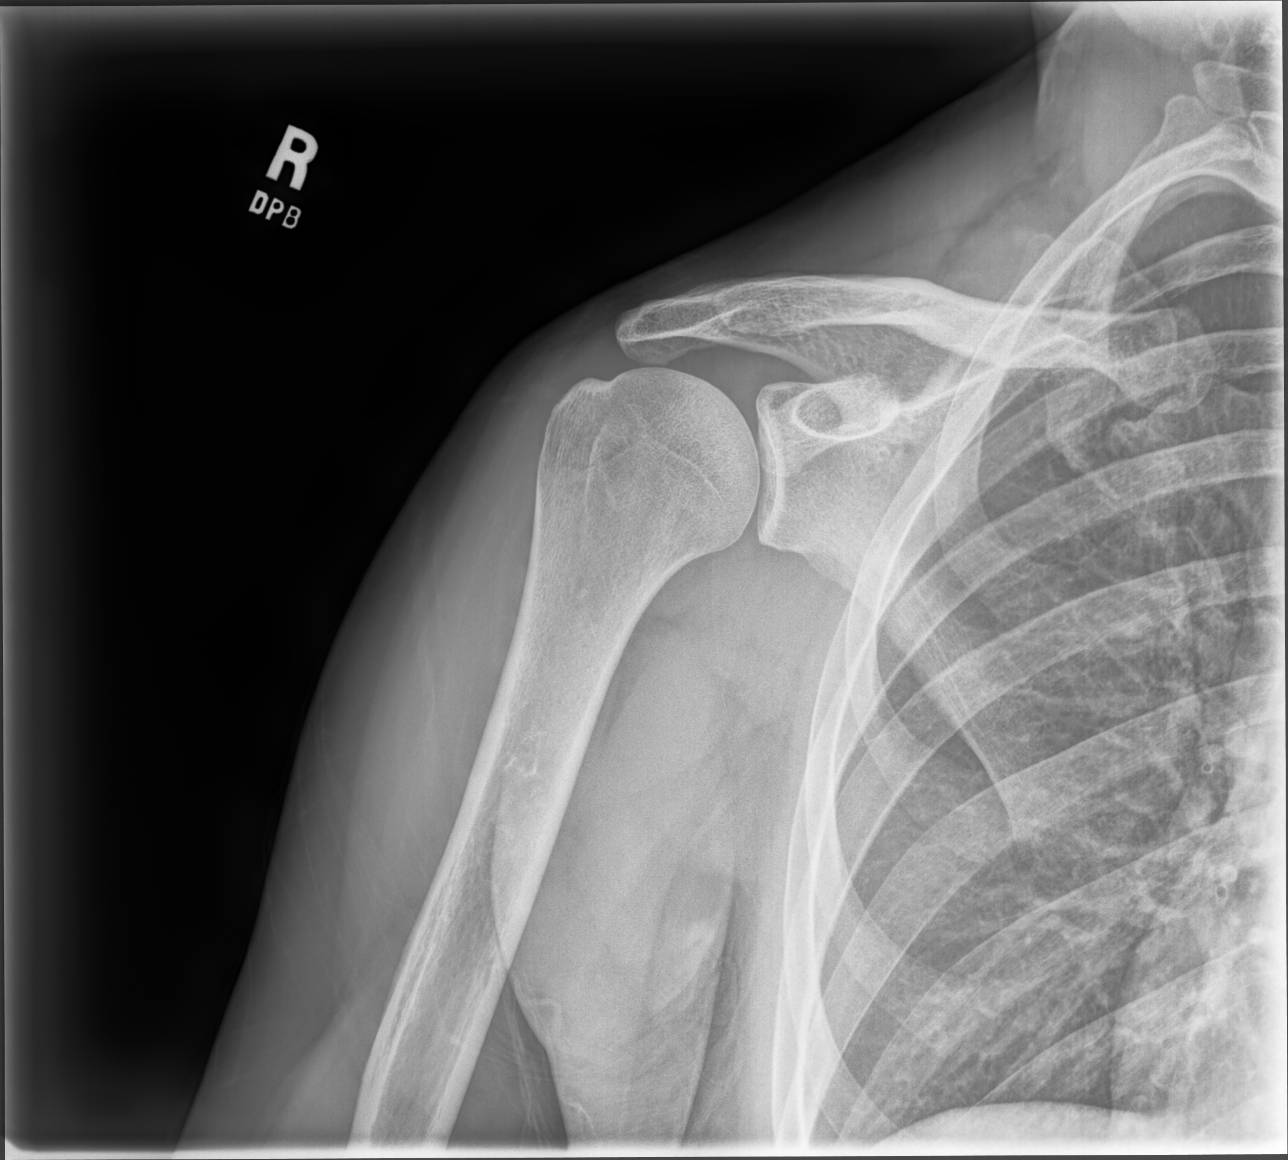

[shoulder y view]
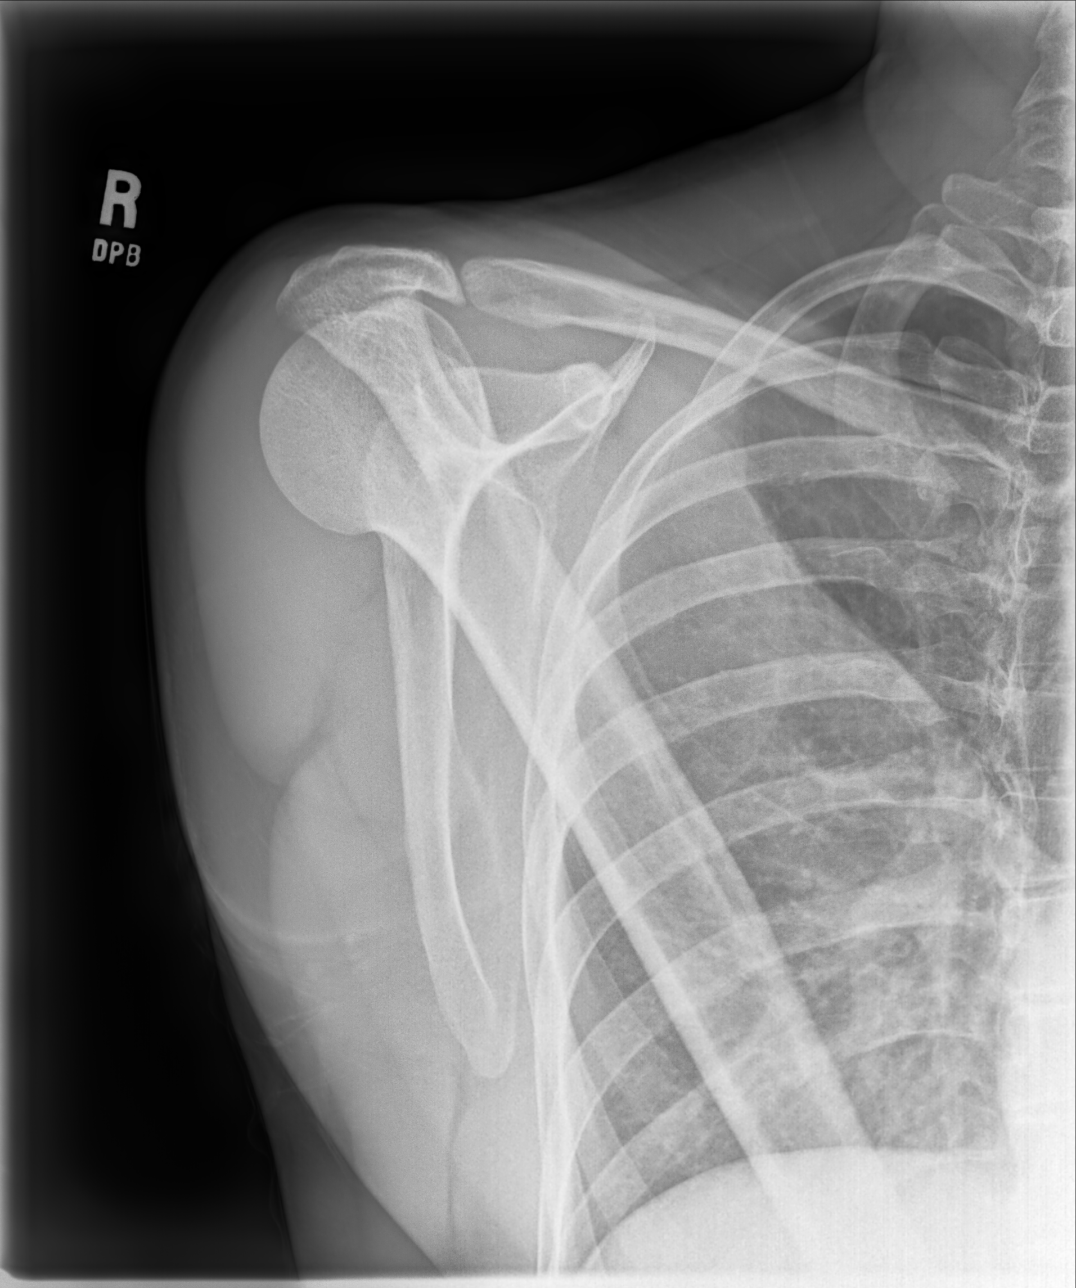

[shoulder axial]
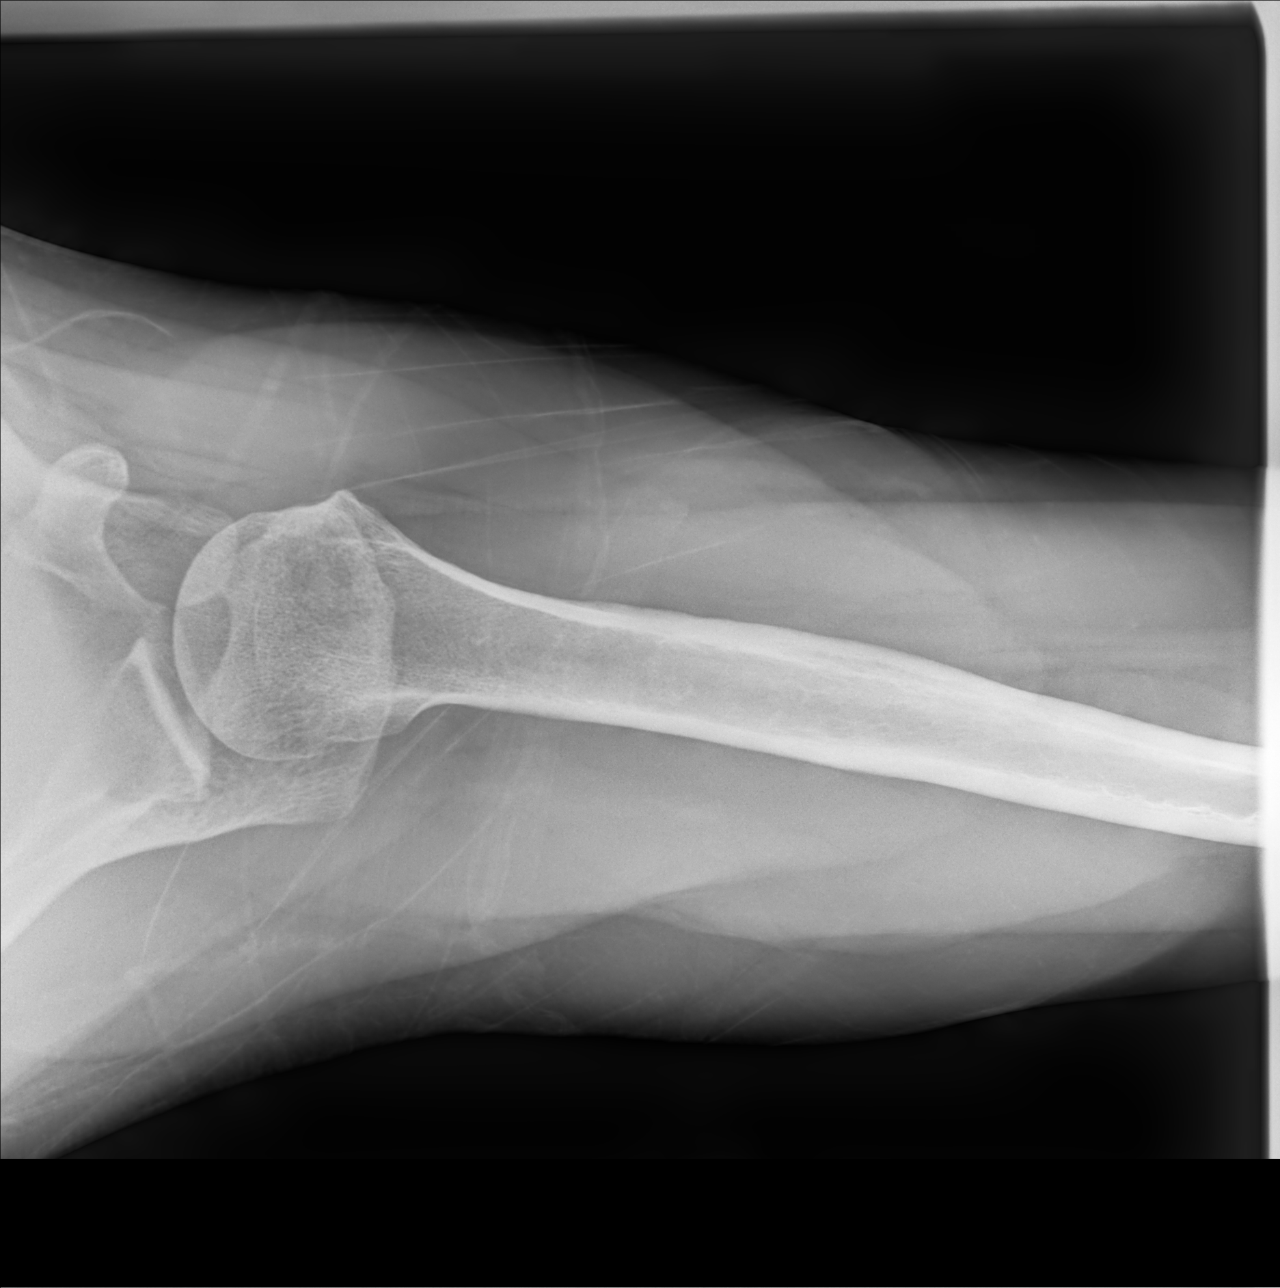

[3 of 3 positions shown; findings below may reference images not displayed]

FINDINGS: Negative for fracture or dislocation. Normal alignment at the right
AC joint. Visualized right ribs are intact.
IMPRESSION: No acute abnormality.

## 2022-05-29 ENCOUNTER — Other Ambulatory Visit: Payer: Self-pay | Admitting: Family

## 2022-05-29 MED ORDER — ONDANSETRON 4 MG PO TBDP: 4.0000 mg | ORAL_TABLET | Freq: Three times a day (TID) | ORAL | 0 refills | Status: DC | PRN

## 2022-05-29 NOTE — Progress Notes (Signed)
Pt complaining of nausea. Zofran Prescription sent to pharmacy    Jannifer Rodney, FNP

## 2022-06-08 ENCOUNTER — Encounter: Payer: Self-pay | Admitting: *Deleted

## 2022-09-12 ENCOUNTER — Encounter: Payer: Self-pay | Admitting: Family Medicine

## 2022-09-12 ENCOUNTER — Ambulatory Visit (INDEPENDENT_AMBULATORY_CARE_PROVIDER_SITE_OTHER): Payer: 59 | Admitting: Family Medicine

## 2022-09-12 VITALS — BP 108/68 | HR 60 | Temp 97.7°F | Ht 71.0 in | Wt 199.8 lb

## 2022-09-12 DIAGNOSIS — E785 Hyperlipidemia, unspecified: Secondary | ICD-10-CM

## 2022-09-12 DIAGNOSIS — K219 Gastro-esophageal reflux disease without esophagitis: Secondary | ICD-10-CM

## 2022-09-12 DIAGNOSIS — Z0001 Encounter for general adult medical examination with abnormal findings: Secondary | ICD-10-CM

## 2022-09-12 DIAGNOSIS — J302 Other seasonal allergic rhinitis: Secondary | ICD-10-CM

## 2022-09-12 NOTE — Patient Instructions (Signed)
It was very nice to see you today!  Keep up the great work!  We will see you back in a year for your next physical.  Come back sooner if needed.  Take care, Dr Jerline Pain  PLEASE NOTE:  If you had any lab tests, please let us know if you have not heard back within a few days. You may see your results on mychart before we have a chance to review them but we will give you a call once they are reviewed by [place].   If we ordered any referrals today, please let us know if you have not heard from their office within the next week.   If you had any urgent prescriptions sent in today, please check with the pharmacy within an hour of our visit to make sure the prescription was transmitted appropriately.   Please try these tips to maintain a healthy lifestyle:  Eat at least 3 REAL meals and 1-2 snacks per day.  Aim for no more than 5 hours between eating.  If you eat breakfast, please do so within one hour of getting up.   Each meal should contain half fruits/vegetables, one quarter protein, and one quarter carbs (no bigger than a computer mouse)  Cut down on sweet beverages. This includes juice, soda, and sweet tea.   Drink at least 1 glass of water with each meal and aim for at least 8 glasses per day  Exercise at least 150 minutes every week.    Preventive Care 50-35 Years Old, Male Preventive care refers to lifestyle choices and visits with your health care provider that can promote health and wellness. Preventive care visits are also called wellness exams. What can I expect for my preventive care visit? Counseling During your preventive care visit, your health care provider may ask about your: Medical history, including: Past medical problems. Family medical history. Current health, including: Emotional well-being. Home life and relationship well-being. Sexual activity. Lifestyle, including: Alcohol, nicotine or tobacco, and drug use. Access to firearms. Diet, exercise, and sleep  habits. Safety issues such as seatbelt and bike helmet use. Sunscreen use. Work and work Statistician. Physical exam Your health care provider will check your: Height and weight. These may be used to calculate your BMI (body mass index). BMI is a measurement that tells if you are at a healthy weight. Waist circumference. This measures the distance around your waistline. This measurement also tells if you are at a healthy weight and may help predict your risk of certain diseases, such as type 2 diabetes and high blood pressure. Heart rate and blood pressure. Body temperature. Skin for abnormal spots. What immunizations do I need?  Vaccines are usually given at various ages, according to a schedule. Your health care provider will recommend vaccines for you based on your age, medical history, and lifestyle or other factors, such as travel or where you work. What tests do I need? Screening Your health care provider may recommend screening tests for certain conditions. This may include: Lipid and cholesterol levels. Diabetes screening. This is done by checking your blood sugar (glucose) after you have not eaten for a while (fasting). Hepatitis B test. Hepatitis C test. HIV (human immunodeficiency virus) test. STI (sexually transmitted infection) testing, if you are at risk. Lung cancer screening. Prostate cancer screening. Colorectal cancer screening. Talk with your health care provider about your test results, treatment options, and if necessary, the need for more tests. Follow these instructions at home: Eating and drinking  Eat  a diet that includes fresh fruits and vegetables, whole grains, lean protein, and low-fat dairy products. Take vitamin and mineral supplements as recommended by your health care provider. Do not drink alcohol if your health care provider tells you not to drink. If you drink alcohol: Limit how much you have to 0-2 drinks a day. Know how much alcohol is in your  drink. In the [place] one drink equals one 12 oz bottle of beer (355 mL), one 5 oz glass of wine (148 mL), or one 1 oz glass of hard liquor (44 mL). Lifestyle Brush your teeth every morning and night with fluoride toothpaste. Floss one time each day. Exercise for at least 30 minutes 5 or more days each week. Do not use any products that contain nicotine or tobacco. These products include cigarettes, chewing tobacco, and vaping devices, such as e-cigarettes. If you need help quitting, ask your health care provider. Do not use drugs. If you are sexually active, practice safe sex. Use a condom or other form of protection to prevent STIs. Take aspirin only as told by your health care provider. Make sure that you understand how much to take and what form to take. Work with your health care provider to find out whether it is safe and beneficial for you to take aspirin daily. Find healthy ways to manage stress, such as: Meditation, yoga, or listening to music. Journaling. Talking to a trusted person. Spending time with friends and family. Minimize exposure to UV radiation to reduce your risk of skin cancer. Safety Always wear your seat belt while driving or riding in a vehicle. Do not drive: If you have been drinking alcohol. Do not ride with someone who has been drinking. When you are tired or distracted. While texting. If you have been using any mind-altering substances or drugs. Wear a helmet and other protective equipment during sports activities. If you have firearms in your house, make sure you follow all gun safety procedures. What's next? Go to your health care provider once a year for an annual wellness visit. Ask your health care provider how often you should have your eyes and teeth checked. Stay up to date on all vaccines. This information is not intended to replace advice given to you by your health care provider. Make sure you discuss any questions you have with your health care  provider. Document Revised: 12/08/2020 Document Reviewed: 12/08/2020 Elsevier Patient Education  [employer].

## 2022-09-12 NOTE — Assessment & Plan Note (Signed)
Stable on pepcid as needed.

## 2022-09-12 NOTE — Assessment & Plan Note (Signed)
Stable on Claritin and Flonase as needed seasonally.

## 2022-09-12 NOTE — Progress Notes (Signed)
Chief Complaint:  Dean Cross is a 42 y.o. male who presents today for his annual comprehensive physical exam.    Assessment/Plan:  Chronic Problems Addressed Today: Seasonal allergies Stable on Claritin and Flonase as needed seasonally.  Dyslipidemia Last lipid panel at goal.  GERD (gastroesophageal reflux disease) Stable on pepcid as needed.   Preventative Healthcare: Recently had labs done.  These were at goal.  Due for colonoscopy age 21.  Up-to-date on vaccines.  Patient Counseling(The following topics were reviewed and/or handout was given):  -Nutrition: Stressed importance of moderation in sodium/caffeine intake, saturated fat and cholesterol, caloric balance, sufficient intake of fresh fruits, vegetables, and fiber.  -Stressed the importance of regular exercise.   -Substance Abuse: Discussed cessation/primary prevention of tobacco, alcohol, or other drug use; driving or other dangerous activities under the influence; availability of treatment for abuse.   -Injury prevention: Discussed safety belts, safety helmets, smoke detector, smoking near bedding or upholstery.   -Sexuality: Discussed sexually transmitted diseases, partner selection, use of condoms, avoidance of unintended pregnancy and contraceptive alternatives.   -Dental health: Discussed importance of regular tooth brushing, flossing, and dental visits.  -Health maintenance and immunizations reviewed. Please refer to Health maintenance section.  Return to care in 1 year for next preventative visit.     Subjective:  HPI:  He has no acute complaints today.   Lifestyle Diet: None specific.  Exercise: running and playing soccer.      09/12/2022   12:53 PM  Depression screen PHQ 2/9  Decreased Interest 0  Down, Depressed, Hopeless 0  PHQ - 2 Score 0    There are no preventive care reminders to display for this patient.   ROS: Per HPI, otherwise a complete review of systems was negative.    PMH:  The following were reviewed and entered/updated in epic: Past Medical History:  Diagnosis Date   Allergy    celiac's and seasonal   Celiac disease    GERD (gastroesophageal reflux disease)    Patient Active Problem List   Diagnosis Date Noted   Dyslipidemia 11/12/2019   GERD (gastroesophageal reflux disease) 08/06/2018   Seasonal allergies 08/06/2018   Celiac disease 12/09/2015   Family history of polycystic kidney disease 08/03/2015   Past Surgical History:  Procedure Laterality Date   APPENDECTOMY     1992   FINGER AMPUTATION  2000   HERNIA REPAIR  2011   b/l inguinal hernia    VASECTOMY  2018    Family History  Problem Relation Age of Onset   Cancer Mother        thyroid   Hyperlipidemia Father    Polycystic kidney disease Father    Asthma Sister    Depression Sister    Celiac disease Sister    Arthritis Sister    Arnold-Chiari malformation Sister    Depression Sister    Heart disease Paternal Grandfather    Hyperlipidemia Paternal Grandfather    Lung cancer Maternal Grandmother    Prostate cancer Neg Hx    Colon cancer Neg Hx     Medications- reviewed and updated Current Outpatient Medications  Medication Sig Dispense Refill   fluticasone (FLONASE) 50 MCG/ACT nasal spray Place into both nostrils daily.     loratadine (CLARITIN) 10 MG tablet Take 10 mg by mouth daily.     No current facility-administered medications for this visit.    Allergies-reviewed and updated No Known Allergies  Social History   Socioeconomic History   Marital status: Married  Spouse name: Not on file   Number of children: Not on file   Years of education: Not on file   Highest education level: Not on file  Occupational History   Not on file  Tobacco Use   Smoking status: Never   Smokeless tobacco: Never  Substance and Sexual Activity   Alcohol use: No   Drug use: No   Sexual activity: Yes  Other Topics Concern   Not on file  Social History Narrative    Not on file   Social Determinants of Health   Financial Resource Strain: Not on file  Food Insecurity: Not on file  Transportation Needs: Not on file  Physical Activity: Not on file  Stress: Not on file  Social Connections: Not on file        Objective:  Physical Exam: BP 108/68   Pulse 60   Temp 97.7 F (36.5 C) (Temporal)   Ht 5\' 11"  (1.803 m)   Wt 199 lb 12.8 oz (90.6 kg)   SpO2 97%   BMI 27.87 kg/m   Body mass index is 27.87 kg/m. Wt Readings from Last 3 Encounters:  09/12/22 199 lb 12.8 oz (90.6 kg)  11/16/20 202 lb 6.4 oz (91.8 kg)  11/11/19 204 lb 12.8 oz (92.9 kg)   Gen: NAD, resting comfortably HEENT: TMs normal bilaterally. OP clear. No thyromegaly noted.  CV: RRR with no murmurs appreciated Pulm: NWOB, CTAB with no crackles, wheezes, or rhonchi GI: Normal bowel sounds present. Soft, Nontender, Nondistended. MSK: no edema, cyanosis, or clubbing noted Skin: warm, dry Neuro: CN2-12 grossly intact. Strength 5/5 in upper and lower extremities. Reflexes symmetric and intact bilaterally.  Psych: Normal affect and thought content     Dean Cross M. Jerline Pain, MD 09/12/2022 1:25 PM

## 2022-09-12 NOTE — Assessment & Plan Note (Signed)
Last lipid panel at goal.

## 2023-01-11 ENCOUNTER — Other Ambulatory Visit: Payer: Self-pay

## 2023-01-11 ENCOUNTER — Encounter (HOSPITAL_BASED_OUTPATIENT_CLINIC_OR_DEPARTMENT_OTHER): Payer: Self-pay | Admitting: Orthopedic Surgery

## 2023-01-11 NOTE — Anesthesia Preprocedure Evaluation (Signed)
Anesthesia Evaluation  Patient identified by MRN, date of birth, ID band Patient awake    Reviewed: Allergy & Precautions, NPO status , Patient's Chart, lab work & pertinent test results  History of Anesthesia Complications Negative for: history of anesthetic complications  Airway Mallampati: II  TM Distance: >3 FB Neck ROM: Full    Dental no notable dental hx.    Pulmonary    Pulmonary exam normal        Cardiovascular negative cardio ROS Normal cardiovascular exam     Neuro/Psych    GI/Hepatic Neg liver ROS,GERD  Controlled,,Celiac dx   Endo/Other  negative endocrine ROS    Renal/GU negative Renal ROS     Musculoskeletal Right ankle fracture   Abdominal   Peds  Hematology   Anesthesia Other Findings   Reproductive/Obstetrics                              Anesthesia Physical Anesthesia Plan  ASA: 2  Anesthesia Plan: General   Post-op Pain Management: Tylenol PO (pre-op)* and Regional block*   Induction: Intravenous  PONV Risk Score and Plan: 3 and Treatment may vary due to age or medical condition, Midazolam, Ondansetron and Dexamethasone  Airway Management Planned: LMA  Additional Equipment: None  Intra-op Plan:   Post-operative Plan: Extubation in OR  Informed Consent: I have reviewed the patients History and Physical, chart, labs and discussed the procedure including the risks, benefits and alternatives for the proposed anesthesia with the patient or authorized representative who has indicated his/her understanding and acceptance.     Dental advisory given  Plan Discussed with: CRNA  Anesthesia Plan Comments:          Anesthesia Quick Evaluation

## 2023-01-15 NOTE — H&P (Signed)
PREOPERATIVE H&P  Chief Complaint: RIGHT  ANKLE FRACTURE  HPI: Dean Cross is a 42 y.o. male who presents with a diagnosis of RIGHT  ANKLE FRACTURE. Symptoms are rated as moderate to severe, and have been worsening.  This is significantly impairing activities of daily living.  He has elected for surgical management.   Past Medical History:  Diagnosis Date   Allergy    celiac's and seasonal   Celiac disease    GERD (gastroesophageal reflux disease)    Past Surgical History:  Procedure Laterality Date   APPENDECTOMY     1992   FINGER AMPUTATION  2000   HERNIA REPAIR  2011   b/l inguinal hernia    VASECTOMY  2018   Social History   Socioeconomic History   Marital status: Married    Spouse name: Not on file   Number of children: Not on file   Years of education: Not on file   Highest education level: Not on file  Occupational History   Not on file  Tobacco Use   Smoking status: Never   Smokeless tobacco: Never  Vaping Use   Vaping status: Never Used  Substance and Sexual Activity   Alcohol use: No   Drug use: No   Sexual activity: Yes  Other Topics Concern   Not on file  Social History Narrative   Not on file   Social Determinants of Health   Financial Resource Strain: Not on file  Food Insecurity: Not on file  Transportation Needs: Not on file  Physical Activity: Not on file  Stress: Not on file  Social Connections: Not on file   Family History  Problem Relation Age of Onset   Cancer Mother        thyroid   Hyperlipidemia Father    Polycystic kidney disease Father    Asthma Sister    Depression Sister    Celiac disease Sister    Arthritis Sister    Arnold-Chiari malformation Sister    Depression Sister    Heart disease Paternal Grandfather    Hyperlipidemia Paternal Grandfather    Lung cancer Maternal Grandmother    Prostate cancer Neg Hx    Colon cancer Neg Hx    No Known Allergies Prior to Admission medications   Medication Sig Start  Date End Date Taking? Authorizing Provider  acetaminophen (TYLENOL) 325 MG tablet Take 650 mg by mouth every 6 (six) hours as needed.   Yes [provider]  fluticasone (FLONASE) 50 MCG/ACT nasal spray Place into both nostrils daily.   Yes [provider]  loratadine (CLARITIN) 10 MG tablet Take 10 mg by mouth daily.   Yes [provider]     Positive ROS: All other systems have been reviewed and were otherwise negative with the exception of those mentioned in the HPI and as above.  Physical Exam: General: Alert, no acute distress Cardiovascular: No pedal edema Respiratory: No cyanosis, no use of accessory musculature GI: No organomegaly, abdomen is soft and non-tender Skin: No lesions in the area of chief complaint Neurologic: Sensation intact distally Psychiatric: Patient is competent for consent with normal mood and affect Lymphatic: No axillary or cervical lymphadenopathy  MUSCULOSKELETAL: TTP right lower leg mid-shin through ankle, limited ROM d/t pain, edema present, calf compressible, NVI   Imaging: 3v xrays show a comminuted, moderately displaced fracture of the distal third of the right fibula shaft with lateral displacement of the distal portion and a vertical, mildly displaced fracture of the posterior  malleolus of the right tibia   Assessment: RIGHT  ANKLE FRACTURE  Plan: Plan for Procedure(s): OPEN REDUCTION INTERNAL FIXATION (ORIF) ANKLE FRACTURE  The risks benefits and alternatives were discussed with the patient including but not limited to the risks of nonoperative treatment, versus surgical intervention including infection, bleeding, nerve injury,  blood clots, cardiopulmonary complications, morbidity, mortality, among others, and they were willing to proceed.   Weightbearing: NWB RLE Orthopedic devices: splint Showering: POD 3 Dressing: reinforce PRN Medicines: ASA, Oxy, Tylenol, Mobic, Baclofen, Zofran  Discharge: home Follow up:  01/26/23 at 11:30am    [facility] Board Office [phone] 01/15/2023 1:48 PM

## 2023-01-16 ENCOUNTER — Ambulatory Visit (HOSPITAL_BASED_OUTPATIENT_CLINIC_OR_DEPARTMENT_OTHER): Payer: 59 | Admitting: Anesthesiology

## 2023-01-16 ENCOUNTER — Ambulatory Visit (HOSPITAL_BASED_OUTPATIENT_CLINIC_OR_DEPARTMENT_OTHER): Payer: 59

## 2023-01-16 ENCOUNTER — Encounter (HOSPITAL_BASED_OUTPATIENT_CLINIC_OR_DEPARTMENT_OTHER): Payer: Self-pay | Admitting: Orthopedic Surgery

## 2023-01-16 ENCOUNTER — Other Ambulatory Visit: Payer: Self-pay

## 2023-01-16 ENCOUNTER — Ambulatory Visit (HOSPITAL_BASED_OUTPATIENT_CLINIC_OR_DEPARTMENT_OTHER)
Admission: RE | Admit: 2023-01-16 | Discharge: 2023-01-16 | Disposition: A | Discharge status: Home / Self Care | Payer: 59 | Attending: Orthopedic Surgery | Admitting: Orthopedic Surgery

## 2023-01-16 ENCOUNTER — Encounter (HOSPITAL_BASED_OUTPATIENT_CLINIC_OR_DEPARTMENT_OTHER)
Admission: RE | Disposition: A | Discharge status: Home / Self Care | Payer: Self-pay | Source: Home / Self Care | Attending: Orthopedic Surgery

## 2023-01-16 DIAGNOSIS — S82391A Other fracture of lower end of right tibia, initial encounter for closed fracture: Secondary | ICD-10-CM | POA: Diagnosis not present

## 2023-01-16 DIAGNOSIS — S82851A Displaced trimalleolar fracture of right lower leg, initial encounter for closed fracture: Secondary | ICD-10-CM | POA: Diagnosis present

## 2023-01-16 DIAGNOSIS — G8918 Other acute postprocedural pain: Secondary | ICD-10-CM | POA: Diagnosis not present

## 2023-01-16 DIAGNOSIS — S82451A Displaced comminuted fracture of shaft of right fibula, initial encounter for closed fracture: Secondary | ICD-10-CM | POA: Insufficient documentation

## 2023-01-16 DIAGNOSIS — S8291XA Unspecified fracture of right lower leg, initial encounter for closed fracture: Secondary | ICD-10-CM | POA: Diagnosis not present

## 2023-01-16 DIAGNOSIS — S93401A Sprain of unspecified ligament of right ankle, initial encounter: Secondary | ICD-10-CM | POA: Diagnosis not present

## 2023-01-16 DIAGNOSIS — X58XXXA Exposure to other specified factors, initial encounter: Secondary | ICD-10-CM | POA: Diagnosis not present

## 2023-01-16 DIAGNOSIS — S93431A Sprain of tibiofibular ligament of right ankle, initial encounter: Secondary | ICD-10-CM | POA: Insufficient documentation

## 2023-01-16 HISTORY — PX: ORIF ANKLE FRACTURE: SHX5408

## 2023-01-16 SURGERY — OPEN REDUCTION INTERNAL FIXATION (ORIF) ANKLE FRACTURE
Anesthesia: General | Site: Ankle | Laterality: Right

## 2023-01-16 MED ORDER — CEFAZOLIN SODIUM-DEXTROSE 2-4 GM/100ML-% IV SOLN
INTRAVENOUS | Status: AC
  Filled 2023-01-16: qty 100

## 2023-01-16 MED ORDER — BUPIVACAINE-EPINEPHRINE (PF) 0.5% -1:200000 IJ SOLN
INTRAMUSCULAR | Status: DC | PRN
  Administered 2023-01-16: 20 mL via PERINEURAL
  Administered 2023-01-16: 10 mL via PERINEURAL

## 2023-01-16 MED ORDER — OXYCODONE HCL 5 MG PO TABS: 5.0000 mg | ORAL_TABLET | Freq: Once | ORAL | Status: DC | PRN

## 2023-01-16 MED ORDER — ONDANSETRON HCL 4 MG/2ML IJ SOLN
INTRAMUSCULAR | Status: DC | PRN
Start: 2023-01-16 — End: 2023-01-16
  Administered 2023-01-16: 4 mg via INTRAVENOUS

## 2023-01-16 MED ORDER — FENTANYL CITRATE (PF) 100 MCG/2ML IJ SOLN
50.0000 ug | Freq: Once | INTRAMUSCULAR | Status: AC
  Administered 2023-01-16: 50 ug via INTRAVENOUS

## 2023-01-16 MED ORDER — CEFAZOLIN SODIUM-DEXTROSE 2-4 GM/100ML-% IV SOLN
2.0000 g | INTRAVENOUS | Status: AC
  Administered 2023-01-16: 2 g via INTRAVENOUS

## 2023-01-16 MED ORDER — FENTANYL CITRATE (PF) 100 MCG/2ML IJ SOLN
INTRAMUSCULAR | Status: DC | PRN
  Administered 2023-01-16: 50 ug via INTRAVENOUS

## 2023-01-16 MED ORDER — 0.9 % SODIUM CHLORIDE (POUR BTL) OPTIME
TOPICAL | Status: DC | PRN
  Administered 2023-01-16: 200 mL

## 2023-01-16 MED ORDER — EPHEDRINE SULFATE (PRESSORS) 50 MG/ML IJ SOLN
INTRAMUSCULAR | Status: DC | PRN
  Administered 2023-01-16: 5 mg via INTRAVENOUS

## 2023-01-16 MED ORDER — OXYCODONE HCL 5 MG/5ML PO SOLN: 5.0000 mg | Freq: Once | ORAL | Status: DC | PRN

## 2023-01-16 MED ORDER — HYDROMORPHONE HCL 1 MG/ML IJ SOLN: 0.2500 mg | INTRAMUSCULAR | Status: DC | PRN

## 2023-01-16 MED ORDER — ACETAMINOPHEN 500 MG PO TABS
ORAL_TABLET | ORAL | Status: AC
  Filled 2023-01-16: qty 2

## 2023-01-16 MED ORDER — DEXAMETHASONE SODIUM PHOSPHATE 10 MG/ML IJ SOLN
INTRAMUSCULAR | Status: AC
  Filled 2023-01-16: qty 1

## 2023-01-16 MED ORDER — BUPIVACAINE LIPOSOME 1.3 % IJ SUSP
INTRAMUSCULAR | Status: DC | PRN
  Administered 2023-01-16: 10 mL via PERINEURAL

## 2023-01-16 MED ORDER — POVIDONE-IODINE 7.5 % EX SOLN: Freq: Once | CUTANEOUS | Status: DC

## 2023-01-16 MED ORDER — LIDOCAINE HCL (CARDIAC) PF 100 MG/5ML IV SOSY
PREFILLED_SYRINGE | INTRAVENOUS | Status: DC | PRN
  Administered 2023-01-16: 100 mg via INTRAVENOUS

## 2023-01-16 MED ORDER — BACLOFEN 10 MG PO TABS: 10.0000 mg | ORAL_TABLET | Freq: Three times a day (TID) | ORAL | 0 refills | Status: DC | PRN

## 2023-01-16 MED ORDER — PROPOFOL 10 MG/ML IV BOLUS
INTRAVENOUS | Status: DC | PRN
Start: 2023-01-16 — End: 2023-01-16
  Administered 2023-01-16: 200 mg via INTRAVENOUS

## 2023-01-16 MED ORDER — ONDANSETRON 4 MG PO TBDP: 4.0000 mg | ORAL_TABLET | Freq: Three times a day (TID) | ORAL | 0 refills | Status: DC | PRN

## 2023-01-16 MED ORDER — LACTATED RINGERS IV SOLN: INTRAVENOUS | Status: DC

## 2023-01-16 MED ORDER — MIDAZOLAM HCL 2 MG/2ML IJ SOLN
2.0000 mg | Freq: Once | INTRAMUSCULAR | Status: AC
  Administered 2023-01-16: 2 mg via INTRAVENOUS

## 2023-01-16 MED ORDER — ACETAMINOPHEN 500 MG PO TABS: 1000.0000 mg | ORAL_TABLET | Freq: Once | ORAL | Status: DC

## 2023-01-16 MED ORDER — ACETAMINOPHEN 500 MG PO TABS: 1000.0000 mg | ORAL_TABLET | Freq: Four times a day (QID) | ORAL | 0 refills | Status: AC | PRN

## 2023-01-16 MED ORDER — POVIDONE-IODINE 10 % EX SWAB: 2.0000 | Freq: Once | CUTANEOUS | Status: DC

## 2023-01-16 MED ORDER — FENTANYL CITRATE (PF) 100 MCG/2ML IJ SOLN
INTRAMUSCULAR | Status: AC
  Filled 2023-01-16: qty 2

## 2023-01-16 MED ORDER — ONDANSETRON HCL 4 MG/2ML IJ SOLN: 4.0000 mg | Freq: Once | INTRAMUSCULAR | Status: DC | PRN

## 2023-01-16 MED ORDER — ONDANSETRON HCL 4 MG/2ML IJ SOLN
INTRAMUSCULAR | Status: AC
  Filled 2023-01-16: qty 2

## 2023-01-16 MED ORDER — OXYCODONE HCL 5 MG PO TABS: 5.0000 mg | ORAL_TABLET | Freq: Four times a day (QID) | ORAL | 0 refills | Status: DC | PRN

## 2023-01-16 MED ORDER — KETOROLAC TROMETHAMINE 30 MG/ML IJ SOLN: 30.0000 mg | Freq: Once | INTRAMUSCULAR | Status: DC | PRN

## 2023-01-16 MED ORDER — DEXAMETHASONE SODIUM PHOSPHATE 10 MG/ML IJ SOLN
INTRAMUSCULAR | Status: DC | PRN
  Administered 2023-01-16: 4 mg via INTRAVENOUS

## 2023-01-16 MED ORDER — ASPIRIN 81 MG PO TBEC: 81.0000 mg | DELAYED_RELEASE_TABLET | Freq: Two times a day (BID) | ORAL | 0 refills | Status: DC

## 2023-01-16 MED ORDER — MELOXICAM 15 MG PO TABS: 15.0000 mg | ORAL_TABLET | Freq: Every day | ORAL | 0 refills | Status: DC | PRN

## 2023-01-16 MED ORDER — MIDAZOLAM HCL 2 MG/2ML IJ SOLN
INTRAMUSCULAR | Status: AC
  Filled 2023-01-16: qty 2

## 2023-01-16 SURGICAL SUPPLY — 79 items
ANCH SUT 2 NDL DX FBRTK (Anchor) ×1 IMPLANT
ANCHOR KNOTLESS SUT DX #2 (Anchor) IMPLANT
APL PRP STRL LF DISP 70% ISPRP (MISCELLANEOUS) ×1
BANDAGE ESMARK 6X9 LF (GAUZE/BANDAGES/DRESSINGS) ×1 IMPLANT
BIT DRILL 2.6 (BIT) ×1
BIT DRILL 2.6X VARIAX 2 (BIT) IMPLANT
BIT DRL 2.6X VARIAX 2 (BIT) ×1
BLADE SURG 15 STRL LF DISP TIS (BLADE) ×2 IMPLANT
BLADE SURG 15 STRL SS (BLADE) ×2
BNDG CMPR 5X4 CHSV STRCH STRL (GAUZE/BANDAGES/DRESSINGS) ×1
BNDG CMPR 5X4 KNIT ELC UNQ LF (GAUZE/BANDAGES/DRESSINGS) ×1
BNDG CMPR 6 X 5 YARDS HK CLSR (GAUZE/BANDAGES/DRESSINGS) ×2
BNDG CMPR 9X6 STRL LF SNTH (GAUZE/BANDAGES/DRESSINGS) ×1
BNDG COHESIVE 4X5 TAN STRL LF (GAUZE/BANDAGES/DRESSINGS) ×1 IMPLANT
BNDG ELASTIC 4INX 5YD STR LF (GAUZE/BANDAGES/DRESSINGS) ×1 IMPLANT
BNDG ELASTIC 6INX 5YD STR LF (GAUZE/BANDAGES/DRESSINGS) ×1 IMPLANT
BNDG ESMARK 6X9 LF (GAUZE/BANDAGES/DRESSINGS) ×1
CHLORAPREP W/TINT 26 (MISCELLANEOUS) ×1 IMPLANT
CLSR STERI-STRIP ANTIMIC 1/2X4 (GAUZE/BANDAGES/DRESSINGS) ×1 IMPLANT
COVER BACK TABLE 60X90IN (DRAPES) ×1 IMPLANT
CUFF TOURN SGL QUICK 24 (TOURNIQUET CUFF)
CUFF TOURN SGL QUICK 34 (TOURNIQUET CUFF) ×1
CUFF TRNQT CYL 24X4X16.5-23 (TOURNIQUET CUFF) IMPLANT
CUFF TRNQT CYL 34X4.125X (TOURNIQUET CUFF) IMPLANT
DRAPE EXTREMITY T 121X128X90 (DISPOSABLE) ×1 IMPLANT
DRAPE IMP U-DRAPE 54X76 (DRAPES) ×1 IMPLANT
DRAPE OEC MINIVIEW 54X84 (DRAPES) ×1 IMPLANT
DRAPE U-SHAPE 47X51 STRL (DRAPES) ×1 IMPLANT
DRSG EMULSION OIL 3X3 NADH (GAUZE/BANDAGES/DRESSINGS) ×1 IMPLANT
ELECT REM PT RETURN 9FT ADLT (ELECTROSURGICAL) ×1
ELECTRODE REM PT RTRN 9FT ADLT (ELECTROSURGICAL) ×1 IMPLANT
GAUZE PAD ABD 8X10 STRL (GAUZE/BANDAGES/DRESSINGS) ×1 IMPLANT
GAUZE SPONGE 4X4 12PLY STRL (GAUZE/BANDAGES/DRESSINGS) ×1 IMPLANT
GLOVE BIO SURGEON STRL SZ7.5 (GLOVE) ×1 IMPLANT
GLOVE BIOGEL PI IND STRL 7.5 (GLOVE) ×1 IMPLANT
GLOVE BIOGEL PI IND STRL 8 (GLOVE) ×1 IMPLANT
GLOVE SURG SYN 7.5 E (GLOVE) ×1
GLOVE SURG SYN 7.5 PF PI (GLOVE) ×1 IMPLANT
GOWN STRL REUS W/ TWL LRG LVL3 (GOWN DISPOSABLE) ×2 IMPLANT
GOWN STRL REUS W/ TWL XL LVL3 (GOWN DISPOSABLE) ×1 IMPLANT
GOWN STRL REUS W/TWL LRG LVL3 (GOWN DISPOSABLE) ×1
GOWN STRL REUS W/TWL XL LVL3 (GOWN DISPOSABLE) ×3
K-WIRE 1.6X150 (WIRE) ×1
K-WIRE FX150X1.6XKRSH (WIRE) ×1
KIT FIBERTAK DX KNTLS DISP (KITS) IMPLANT
KWIRE FX150X1.6XKRSH (WIRE) IMPLANT
NDL HYPO 22X1.5 SAFETY MO (MISCELLANEOUS) IMPLANT
NEEDLE HYPO 22X1.5 SAFETY MO (MISCELLANEOUS)
NS IRRIG 1000ML POUR BTL (IV SOLUTION) ×1 IMPLANT
PACK BASIN DAY SURGERY FS (CUSTOM PROCEDURE TRAY) ×1 IMPLANT
PAD CAST 4YDX4 CTTN HI CHSV (CAST SUPPLIES) ×1 IMPLANT
PADDING CAST ABS COTTON 4X4 ST (CAST SUPPLIES) ×2 IMPLANT
PADDING CAST COTTON 4X4 STRL (CAST SUPPLIES) ×1
PADDING CAST COTTON 6X4 STRL (CAST SUPPLIES) ×1 IMPLANT
PENCIL SMOKE EVACUATOR (MISCELLANEOUS) ×1 IMPLANT
PLATE 1/3 TUBULAR 14H (Plate) IMPLANT
SCREW BONE 18X3.5 (Screw) IMPLANT
SCREW BONE 3.5X16MM (Screw) IMPLANT
SCREW BONE 3.5X20MM (Screw) IMPLANT
SLEEVE SCD COMPRESS KNEE MED (STOCKING) IMPLANT
SPIKE FLUID TRANSFER (MISCELLANEOUS) IMPLANT
SPLINT PLASTER CAST FAST 5X30 (CAST SUPPLIES) ×20 IMPLANT
SPONGE T-LAP 18X18 ~~LOC~~+RFID (SPONGE) IMPLANT
SPONGE T-LAP 4X18 ~~LOC~~+RFID (SPONGE) ×1 IMPLANT
SUCTION TUBE FRAZIER 10FR DISP (SUCTIONS) ×1 IMPLANT
SUT ETHILON 3 0 PS 1 (SUTURE) IMPLANT
SUT MNCRL AB 4-0 PS2 18 (SUTURE) IMPLANT
SUT MON AB 2-0 CT1 36 (SUTURE) IMPLANT
SUT MON AB 3-0 SH 27 (SUTURE)
SUT MON AB 3-0 SH27 (SUTURE) IMPLANT
SUT VIC AB 2-0 SH 27 (SUTURE) ×1
SUT VIC AB 2-0 SH 27XBRD (SUTURE) IMPLANT
SUT VICRYL 0 SH 27 (SUTURE) ×1 IMPLANT
SYNDESMOSIS TIGHTROPE XP (Orthopedic Implant) IMPLANT
SYR BULB EAR ULCER 3OZ GRN STR (SYRINGE) ×1 IMPLANT
SYR CONTROL 10ML LL (SYRINGE) IMPLANT
TOWEL GREEN STERILE FF (TOWEL DISPOSABLE) ×2 IMPLANT
TUBE CONNECTING 20X1/4 (TUBING) ×1 IMPLANT
UNDERPAD 30X36 HEAVY ABSORB (UNDERPADS AND DIAPERS) ×1 IMPLANT

## 2023-01-16 NOTE — Op Note (Signed)
01/16/2023  9:27 AM  PATIENT:  Dean Cross    PRE-OPERATIVE DIAGNOSIS:  RIGHT  ANKLE FRACTURE  POST-OPERATIVE DIAGNOSIS:  Same  PROCEDURE:  OPEN REDUCTION INTERNAL FIXATION (ORIF) ANKLE FRACTURE  SURGEON:  Sheral Apley, MD  ASSISTANT: Levester Fresh, PA-C, he was present and scrubbed throughout the case, critical for completion in a timely fashion, and for retraction, instrumentation, and closure.   ANESTHESIA:   LMA block   PREOPERATIVE INDICATIONS:  Dean Cross is a  42 y.o. male with a diagnosis of RIGHT  ANKLE FRACTURE who failed conservative measures and elected for surgical management.    The risks benefits and alternatives were discussed with the patient preoperatively including but not limited to the risks of infection, bleeding, nerve injury, cardiopulmonary complications, the need for revision surgery, among others, and the patient was willing to proceed.  OPERATIVE IMPLANTS: stryker plate and screw, arthrex tightrope  OPERATIVE FINDINGS: Unstable ankle fracture. Stable syndesmosis post op  BLOOD LOSS: min  COMPLICATIONS: none  TOURNIQUET TIME:  OPERATIVE PROCEDURE:  Patient was identified in the preoperative holding area and site was marked by me He was transported to the operating theater and placed on the table in supine position taking care to pad all bony prominences. After a preincinduction time out anesthesia was induced. The right lower extremity was prepped and draped in normal sterile fashion and a pre-incision timeout was performed. Dean Cross received ancef for preoperative antibiotics.   I made a lateral incision of roughly 7 cm dissection was carried down sharply to the distal fibula and then spreading dissection was used proximally to protect the superficial peroneal nerve. I sharply incised the periosteum and took care to protect the peroneal tendons. I then debrided the fracture site and performed a reduction maneuver which was held  in place with a clamp.   I selected a 14 whole plate. I then made a distal incision over the distal fibula and pinned the plate for syndesmosis fixation. I placed 7 screws proximal and distal to the fracture.   I then placed 2 tight ropes using fluoro guidance to stabilize the syndesmosis.   I assessed the talar tilt and there was significant instabiliity and deltoid rupture. I made a 3cm medial incision and protected the saphenous vein and PT tendon. I placed a knotless fibertack anchor in the medial malleolus. I passed these stitches through the injured deltoid ligament and secured them to the anchors. Post flouro xrays showed stable medial ankle at this point.   I assesed the posterior mal piece and it was small enough to not require fixation as it involved less than 20% of the articular surface  The wound was then thoroughly irrigated and closed using a 0 Vicryl and absorbable Monocryl sutures. He was placed in a short leg splint.   POST OPERATIVE PLAN: Non-weightbearing. DVT prophylaxis will consist of mobilization and chemical px

## 2023-01-16 NOTE — Transfer of Care (Signed)
Immediate Anesthesia Transfer of Care Note  Patient: Ivin Booty Comp  Procedure(s) Performed: OPEN REDUCTION INTERNAL FIXATION (ORIF) ANKLE FRACTURE (Right: Ankle)  Patient Location: PACU  Anesthesia Type:GA combined with regional for post-op pain  Level of Consciousness: sedated  Airway & Oxygen Therapy: Patient Spontanous Breathing and Patient connected to face mask oxygen  Post-op Assessment: Report given to RN  Post vital signs: Reviewed and stable  Last Vitals:  Vitals Value Taken Time  BP    Temp    Pulse 50 01/16/23 1229  Resp 9 01/16/23 1229  SpO2 100 % 01/16/23 1229  Vitals shown include unfiled device data.  Last Pain:  Vitals:   01/16/23 1015  TempSrc: Temporal  PainSc: 2       Patients Stated Pain Goal: 2 (01/16/23 1015)  Complications: No notable events documented.

## 2023-01-16 NOTE — Anesthesia Procedure Notes (Signed)
Anesthesia Regional Block: Adductor canal block   Pre-Anesthetic Checklist: , timeout performed,  Correct Patient, Correct Site, Correct Laterality,  Correct Procedure, Correct Position, site marked,  Risks and benefits discussed,  Pre-op evaluation,  At surgeon's request and post-op pain management  Laterality: Right  Prep: Maximum Sterile Barrier Precautions used, chloraprep       Needles:  Injection technique: Single-shot  Needle Type: Echogenic Stimulator Needle     Needle Length: 9cm  Needle Gauge: 22     Additional Needles:   Procedures:,,,, ultrasound used (permanent image in chart),,    Narrative:  Start time: 01/16/2023 10:24 AM End time: 01/16/2023 10:27 AM Injection made incrementally with aspirations every 5 mL.  Performed by: Personally  Anesthesiologist: Kaylyn Layer, MD  Additional Notes: Risks, benefits, and alternative discussed. Patient gave consent for procedure. Patient prepped and draped in sterile fashion. Sedation administered, patient remains easily responsive to voice. Relevant anatomy identified with ultrasound guidance. Local anesthetic given in 5cc increments with no signs or symptoms of intravascular injection. No pain or paraesthesias with injection. Patient monitored throughout procedure with signs of LAST or immediate complications. Tolerated well. Ultrasound image placed in chart.  Dean Greenhouse, MD

## 2023-01-16 NOTE — Anesthesia Procedure Notes (Signed)
Anesthesia Regional Block: Popliteal block   Pre-Anesthetic Checklist: , timeout performed,  Correct Patient, Correct Site, Correct Laterality,  Correct Procedure, Correct Position, site marked,  Risks and benefits discussed,  Pre-op evaluation,  At surgeon's request and post-op pain management  Laterality: Right  Prep: Maximum Sterile Barrier Precautions used, chloraprep       Needles:  Injection technique: Single-shot  Needle Type: Echogenic Stimulator Needle     Needle Length: 9cm  Needle Gauge: 22     Additional Needles:   Procedures:,,,, ultrasound used (permanent image in chart),,    Narrative:  Start time: 01/16/2023 10:27 AM End time: 01/16/2023 10:29 AM Injection made incrementally with aspirations every 5 mL.  Performed by: Personally  Anesthesiologist: Kaylyn Layer, MD  Additional Notes: Risks, benefits, and alternative discussed. Patient gave consent for procedure. Patient prepped and draped in sterile fashion. Sedation administered, patient remains easily responsive to voice. Relevant anatomy identified with ultrasound guidance. Local anesthetic given in 5cc increments with no signs or symptoms of intravascular injection. No pain or paraesthesias with injection. Patient monitored throughout procedure with signs of LAST or immediate complications. Tolerated well. Ultrasound image placed in chart.  Amalia Greenhouse, MD

## 2023-01-16 NOTE — Interval H&P Note (Signed)
History and Physical Interval Note:  01/16/2023 9:57 AM  Dean Cross  has presented today for surgery, with the diagnosis of RIGHT  ANKLE FRACTURE.  The various methods of treatment have been discussed with the patient and family. After consideration of risks, benefits and other options for treatment, the patient has consented to  Procedure(s): OPEN REDUCTION INTERNAL FIXATION (ORIF) ANKLE FRACTURE (Right) as a surgical intervention.  The patient's history has been reviewed, patient examined, no change in status, stable for surgery.  I have reviewed the patient's chart and labs.  Questions were answered to the patient's satisfaction.     Sheral Apley

## 2023-01-16 NOTE — Anesthesia Postprocedure Evaluation (Signed)
Anesthesia Post Note  Patient: Geologist, engineering  Procedure(s) Performed: OPEN REDUCTION INTERNAL FIXATION (ORIF) ANKLE FRACTURE (Right: Ankle)     Patient location during evaluation: PACU Anesthesia Type: General Level of consciousness: awake and alert Pain management: pain level controlled Vital Signs Assessment: post-procedure vital signs reviewed and stable Respiratory status: spontaneous breathing, nonlabored ventilation and respiratory function stable Cardiovascular status: blood pressure returned to baseline Postop Assessment: no apparent nausea or vomiting Anesthetic complications: no   No notable events documented.  Last Vitals:  Vitals:   01/16/23 1245 01/16/23 1300  BP: 119/72 127/81  Pulse: 60 (!) 50  Resp: 16 14  Temp:    SpO2: 98% 99%    Last Pain:  Vitals:   01/16/23 1300  TempSrc:   PainSc: 0-No pain                 Shanda Howells

## 2023-01-16 NOTE — Anesthesia Procedure Notes (Signed)
Procedure Name: LMA Insertion Date/Time: 01/16/2023 10:48 AM  Performed by: Ronnette Hila, CRNAPre-anesthesia Checklist: Patient identified, Emergency Drugs available, Suction available and Patient being monitored Patient Re-evaluated:Patient Re-evaluated prior to induction Oxygen Delivery Method: Circle system utilized Preoxygenation: Pre-oxygenation with 100% oxygen Induction Type: IV induction Ventilation: Mask ventilation without difficulty LMA: LMA inserted LMA Size: 5.0 Number of attempts: 1 Airway Equipment and Method: Bite block Placement Confirmation: positive ETCO2 Tube secured with: Tape Dental Injury: Teeth and Oropharynx as per pre-operative assessment

## 2023-01-16 NOTE — Progress Notes (Signed)
Assisted Dr. Stephannie Peters with right, adductor canal, popliteal ultrasound guided block. Side rails up, monitors on throughout procedure. See vital signs in flow sheet. Tolerated Procedure well.

## 2023-01-16 NOTE — Discharge Instructions (Addendum)
POST-OPERATIVE OPIOID TAPER INSTRUCTIONS: It is important to wean off of your opioid medication as soon as possible. If you do not need pain medication after your surgery it is ok to stop day one. Opioids include: Codeine, Hydrocodone(Norco, Vicodin), Oxycodone(Percocet, oxycontin) and hydromorphone amongst others.  Long term and even short term use of opiods can cause: Increased pain response Dependence Constipation Depression Respiratory depression And more.  Withdrawal symptoms can include Flu like symptoms Nausea, vomiting And more Techniques to manage these symptoms Hydrate well Eat regular healthy meals Stay active Use relaxation techniques(deep breathing, meditating, yoga) Do Not substitute Alcohol to help with tapering If you have been on opioids for less than two weeks and do not have pain than it is ok to stop all together.  Plan to wean off of opioids This plan should start within one week post op of your joint replacement. Maintain the same interval or time between taking each dose and first decrease the dose.  Cut the total daily intake of opioids by one tablet each day Next start to increase the time between doses. The last dose that should be eliminated is the evening dose.    Post Anesthesia Home Care Instructions  Activity: Get plenty of rest for the remainder of the day. A responsible individual must stay with you for 24 hours following the procedure.  For the next 24 hours, DO NOT: -Drive a car -Paediatric nurse -Drink alcoholic beverages -Take any medication unless instructed by your physician -Make any legal decisions or sign important papers.  Meals: Start with liquid foods such as gelatin or soup. Progress to regular foods as tolerated. Avoid greasy, spicy, heavy foods. If nausea and/or vomiting occur, drink only clear liquids until the nausea and/or vomiting subsides. Call your physician if vomiting continues.  Special Instructions/Symptoms: Your  throat may feel dry or sore from the anesthesia or the breathing tube placed in your throat during surgery. If this causes discomfort, gargle with warm salt water. The discomfort should disappear within 24 hours.  If you had a scopolamine patch placed behind your ear for the management of post- operative nausea and/or vomiting:  1. The medication in the patch is effective for 72 hours, after which it should be removed.  Wrap patch in a tissue and discard in the trash. Wash hands thoroughly with soap and water. 2. You may remove the patch earlier than 72 hours if you experience unpleasant side effects which may include dry mouth, dizziness or visual disturbances. 3. Avoid touching the patch. Wash your hands with soap and water after contact with the patch.     Regional Anesthesia Blocks  1. Numbness or the inability to move the "blocked" extremity may last from 3-48 hours after placement. The length of time depends on the medication injected and your individual response to the medication. If the numbness is not going away after 48 hours, call your surgeon.  2. The extremity that is blocked will need to be protected until the numbness is gone and the  Strength has returned. Because you cannot feel it, you will need to take extra care to avoid injury. Because it may be weak, you may have difficulty moving it or using it. You may not know what position it is in without looking at it while the block is in effect.  3. For blocks in the legs and feet, returning to weight bearing and walking needs to be done carefully. You will need to wait until the numbness is entirely gone  and the strength has returned. You should be able to move your leg and foot normally before you try and bear weight or walk. You will need someone to be with you when you first try to ensure you do not fall and possibly risk injury.  4. Bruising and tenderness at the needle site are common side effects and will resolve in a few  days.  5. Persistent numbness or new problems with movement should be communicated to the surgeon or the [facility] [phone] [place] [phone]).

## 2023-01-18 ENCOUNTER — Encounter (HOSPITAL_BASED_OUTPATIENT_CLINIC_OR_DEPARTMENT_OTHER): Payer: Self-pay | Admitting: Orthopedic Surgery

## 2023-01-30 ENCOUNTER — Ambulatory Visit: Payer: 59 | Admitting: Physical Therapy

## 2023-02-16 ENCOUNTER — Other Ambulatory Visit (HOSPITAL_BASED_OUTPATIENT_CLINIC_OR_DEPARTMENT_OTHER): Payer: Self-pay

## 2023-02-16 ENCOUNTER — Other Ambulatory Visit: Payer: Self-pay | Admitting: Family

## 2023-02-16 MED ORDER — MELOXICAM 15 MG PO TABS
15.0000 mg | ORAL_TABLET | Freq: Every day | ORAL | 1 refills | Status: AC | PRN
  Filled 2023-02-16: qty 90, 90d supply, fill #0

## 2023-02-16 NOTE — Progress Notes (Signed)
Pt complaining of right ankle pain and requesting Mobic refilled. Prescription sent to pharmacy.   Jannifer Rodney, FNP

## 2023-02-23 DIAGNOSIS — S82851D Displaced trimalleolar fracture of right lower leg, subsequent encounter for closed fracture with routine healing: Secondary | ICD-10-CM | POA: Diagnosis not present

## 2023-02-27 ENCOUNTER — Other Ambulatory Visit (HOSPITAL_BASED_OUTPATIENT_CLINIC_OR_DEPARTMENT_OTHER): Payer: Self-pay

## 2023-02-27 ENCOUNTER — Ambulatory Visit: Payer: 59 | Admitting: Physical Therapy

## 2023-03-01 ENCOUNTER — Other Ambulatory Visit: Payer: Self-pay

## 2023-03-01 ENCOUNTER — Ambulatory Visit: Payer: 59 | Attending: Orthopedic Surgery

## 2023-03-01 DIAGNOSIS — M25671 Stiffness of right ankle, not elsewhere classified: Secondary | ICD-10-CM | POA: Diagnosis not present

## 2023-03-01 DIAGNOSIS — M25571 Pain in right ankle and joints of right foot: Secondary | ICD-10-CM | POA: Insufficient documentation

## 2023-03-01 NOTE — Therapy (Signed)
OUTPATIENT PHYSICAL THERAPY LOWER EXTREMITY EVALUATION   Patient Name: Dean Cross MRN: [id number] DOB:10-24-1980, 42 y.o., male Today's Date: 03/01/2023  END OF SESSION:  PT End of Session - 03/01/23 1639     Visit Number 1    Number of Visits 12    Date for PT Re-Evaluation 05/25/23    PT Start Time 1639    PT Stop Time 1730    PT Time Calculation (min) 51 min    Activity Tolerance Patient tolerated treatment well    Behavior During Therapy WFL for tasks assessed/performed             Past Medical History:  Diagnosis Date   Allergy    celiac's and seasonal   Celiac disease    GERD (gastroesophageal reflux disease)    Past Surgical History:  Procedure Laterality Date   APPENDECTOMY     1992   FINGER AMPUTATION  2000   HERNIA REPAIR  2011   b/l inguinal hernia    ORIF ANKLE FRACTURE Right 01/16/2023   Procedure: OPEN REDUCTION INTERNAL FIXATION (ORIF) ANKLE FRACTURE;  Surgeon: Sheral Apley, MD;  Location: [facility];  Service: Orthopedics;  Laterality: Right;   VASECTOMY  2018   Patient Active Problem List   Diagnosis Date Noted   Closed displaced trimalleolar fracture of right ankle 01/16/2023   Dyslipidemia 11/12/2019   GERD (gastroesophageal reflux disease) 08/06/2018   Seasonal allergies 08/06/2018   Celiac disease 12/09/2015   Family history of polycystic kidney disease 08/03/2015    PCP: Ardith Dark, MD  REFERRING PROVIDER: Sheral Apley, MD  REFERRING DIAG: s/p right ankle trimalleolar fracture ORIF 01/16/23  THERAPY DIAG:  Stiffness of right ankle, not elsewhere classified  Pain in right ankle and joints of right foot  Rationale for Evaluation and Treatment: Rehabilitation  ONSET DATE: 01/16/23  SUBJECTIVE:   SUBJECTIVE STATEMENT: Patient reports that he playing soccer when it was his right ankle was kicked by another player on 01/10/23. He had surgery to repair the fracture on 01/16/23. He notes that his ankle  has gone well since surgery. He was in a cast for almost 6 weeks, and he has been in a CAM boot at work since. He has been trying to walk some at home without the boot for short distances. He has been trying to do some range of motion exercises at home.   PERTINENT HISTORY: Allergies PAIN:  Are you having pain? Yes: NPRS scale: 3-4/10 Pain location: right ankle  Pain description: "twinge"  Aggravating factors: certain ankle movements Relieving factors: medication   PRECAUTIONS: None  RED FLAGS: None   WEIGHT BEARING RESTRICTIONS: No  FALLS:  Has patient fallen in last 6 months? No  LIVING ENVIRONMENT: Lives with: lives with their family Lives in: House/apartment Stairs: Yes: Internal: 15 steps; can reach both and External: 3 steps; none; reciprocal pattern ascending and step to pattern descending Has following equipment at home:  CAM boot  OCCUPATION: physician  PLOF: Independent  PATIENT GOALS: return to soccer (twice per week for 1 hour), running (2-3 miles prior to injury), and lifting weights  NEXT MD VISIT: 04/04/23  OBJECTIVE:   PATIENT SURVEYS:  FOTO 45.26  COGNITION: Overall cognitive status: Within functional limits for tasks assessed     SENSATION: Patient reports some numbness along the dorsal surface of his right foot.   EDEMA:  Circumferential: R: 55 cm L: 54.5 cm  PALPATION: TTP: right achilles tendon and fibularis longus  JOINT MOBILITY:  Right talocrural: hypomobile and nonpainful   Right midfoot: hypomobile and nonpainful   Right subtalar: hypomobile and nonpainful   LOWER EXTREMITY ROM:  Active ROM Right eval Left eval  Hip flexion    Hip extension    Hip abduction    Hip adduction    Hip internal rotation    Hip external rotation    Knee flexion    Knee extension    Ankle dorsiflexion -20 0  Ankle plantarflexion 35 55  Ankle inversion 25 30  Ankle eversion 5 27   (Blank rows = not tested)  LOWER EXTREMITY MMT:  MMT  Right eval Left eval  Hip flexion    Hip extension    Hip abduction    Hip adduction    Hip internal rotation    Hip external rotation    Knee flexion    Knee extension    Ankle dorsiflexion 4/5; "very tight"  5/5  Ankle plantarflexion    Ankle inversion 3+/5; limited by pain  5/5  Ankle eversion 3+/5 5/5   (Blank rows = not tested)  FUNCTIONAL TESTS:  L single leg heel raise: 49 reps in 45.38 seconds  BALANCE:  R SLS on firm surface: 6 seconds  L SLS on firm surface: 30 seconds  GAIT: Assistive device utilized: None Level of assistance: Complete Independence Comments: poor weight shift onto right lower extremity and toe off on the right foot due to limited right ankle dorsiflexion   TODAY'S TREATMENT:                                                                                                                              DATE:                                     03/01/23 EXERCISE LOG  Exercise Repetitions and Resistance Comments  Gastroc stretch  3 x 30 seconds    Soleus stretch  3 x 30 seconds    Blank cell = exercise not performed today   PATIENT EDUCATION:  Education details: POC, healing, expectations for soreness and edema, prognosis, and goals for therapy Person educated: Patient Education method: Explanation Education comprehension: verbalized understanding  HOME EXERCISE PROGRAM: [id number]  ASSESSMENT:  CLINICAL IMPRESSION: Patient is a 42 y.o. male who was seen today for physical therapy evaluation and treatment for right ankle stiffness and gait deviations following a trimalleolar fracture and ORIF on 01/16/23. He presented with low pain severity and irritability with right ankle manual muscle testing being the most aggravating to his familiar symptoms. He also exhibited reduced right ankle mobility and strength compared to his left ankle. Recommend that he continue with skilled physical therapy to address his impairments to return to his prior level of  function.   OBJECTIVE IMPAIRMENTS: Abnormal gait, decreased activity tolerance, decreased balance, decreased endurance, decreased mobility, difficulty  walking, decreased ROM, decreased strength, hypomobility, increased edema, impaired sensation, and pain.   ACTIVITY LIMITATIONS: carrying, lifting, standing, squatting, stairs, dressing, and locomotion level  PARTICIPATION LIMITATIONS: meal prep, cleaning, laundry, shopping, and community activity  PERSONAL FACTORS: Time since onset of injury/illness/exacerbation and 1 comorbidity: allergies  are also affecting patient's functional outcome.   REHAB POTENTIAL: Good  CLINICAL DECISION MAKING: Evolving/moderate complexity  EVALUATION COMPLEXITY: Moderate   GOALS: Goals reviewed with patient? Yes  SHORT TERM GOALS: Target date: 03/22/23 Patient will be independent with his initial HEP.  Baseline: Goal status: INITIAL  2.  Patient will be able to demonstrate right ankle dorsiflexion to within 10 degrees of neutral for improved gait mechanics.  Baseline:  Goal status: INITIAL  3.  Patient will improve his single leg stance time on his right lower extremity to at least 12 seconds for improved right ankle stability.  Baseline:  Goal status: INITIAL  4.  Patient will improve his right ankle eversion to at least 18 degrees for improved function walking on uneven terrain.  Baseline:  Goal status: INITIAL  LONG TERM GOALS: Target date: 04/12/23  Patient will be independent with his advanced HEP.  Baseline:  Goal status: INITIAL  2.  Patient will improve his right ankle dorsiflexion to at least neutral for improved ankle mobility.  Baseline:  Goal status: INITIAL  3.  Patient will be able to ambulate with no significant gait deviations.  Baseline:  Goal status: INITIAL  4.  Patient will be able to navigate at least 4 steps with a reciprocal pattern for improved household mobility.  Baseline:  Goal status: INITIAL  5.  Patient  will be able to demonstrate at least 30 repetitions of single leg heel raises on his right lower extremity for improved strength and endurance needed for functional activities such as running.  Baseline:  Goal status: INITIAL  6.  Patient will be able to run with no significant gait deviations.  Baseline:  Goal status: INITIAL   PLAN:  PT FREQUENCY: 2x/week  PT DURATION: 6 weeks  PLANNED INTERVENTIONS: Therapeutic exercises, Therapeutic activity, Neuromuscular re-education, Balance training, Gait training, Patient/Family education, Self Care, Joint mobilization, Stair training, Electrical stimulation, Cryotherapy, Moist heat, Vasopneumatic device, Manual therapy, and Re-evaluation  PLAN FOR NEXT SESSION: Nustep, review and update HEP as needed, right ankle AROM and PROM, gait training (wean out of CAM boot, as able), and modalities as needed   Granville Lewis, PT 03/01/2023, 6:27 PM

## 2023-03-06 ENCOUNTER — Ambulatory Visit: Payer: 59

## 2023-03-06 DIAGNOSIS — M25571 Pain in right ankle and joints of right foot: Secondary | ICD-10-CM

## 2023-03-06 DIAGNOSIS — M25671 Stiffness of right ankle, not elsewhere classified: Secondary | ICD-10-CM

## 2023-03-06 NOTE — Therapy (Signed)
OUTPATIENT PHYSICAL THERAPY LOWER EXTREMITY TREATMENT   Patient Name: Dean Cross MRN: [id number] DOB:21-Aug-1980, 42 y.o., male Today's Date: 03/06/2023  END OF SESSION:  PT End of Session - 03/06/23 1110     Visit Number 2    Number of Visits 12    Date for PT Re-Evaluation 05/25/23    PT Start Time 1100    PT Stop Time 1152    PT Time Calculation (min) 52 min    Activity Tolerance Patient tolerated treatment well    Behavior During Therapy WFL for tasks assessed/performed             Past Medical History:  Diagnosis Date   Allergy    celiac's and seasonal   Celiac disease    GERD (gastroesophageal reflux disease)    Past Surgical History:  Procedure Laterality Date   APPENDECTOMY     1992   FINGER AMPUTATION  2000   HERNIA REPAIR  2011   b/l inguinal hernia    ORIF ANKLE FRACTURE Right 01/16/2023   Procedure: OPEN REDUCTION INTERNAL FIXATION (ORIF) ANKLE FRACTURE;  Surgeon: Sheral Apley, MD;  Location: [facility];  Service: Orthopedics;  Laterality: Right;   VASECTOMY  2018   Patient Active Problem List   Diagnosis Date Noted   Closed displaced trimalleolar fracture of right ankle 01/16/2023   Dyslipidemia 11/12/2019   GERD (gastroesophageal reflux disease) 08/06/2018   Seasonal allergies 08/06/2018   Celiac disease 12/09/2015   Family history of polycystic kidney disease 08/03/2015    PCP: Ardith Dark, MD  REFERRING PROVIDER: Sheral Apley, MD  REFERRING DIAG: s/p right ankle trimalleolar fracture ORIF 01/16/23  THERAPY DIAG:  Stiffness of right ankle, not elsewhere classified  Pain in right ankle and joints of right foot  Rationale for Evaluation and Treatment: Rehabilitation  ONSET DATE: 01/16/23  SUBJECTIVE:   SUBJECTIVE STATEMENT: Pt reports no pain while seated, minimal pain with ambulation.  CAM boot donned.   PERTINENT HISTORY: Allergies PAIN:  Are you having pain? Yes: NPRS scale: no number  given/10 Pain location: right ankle  Pain description: "twinge"  Aggravating factors: certain ankle movements Relieving factors: medication   PRECAUTIONS: None  RED FLAGS: None   WEIGHT BEARING RESTRICTIONS: No  FALLS:  Has patient fallen in last 6 months? No  LIVING ENVIRONMENT: Lives with: lives with their family Lives in: House/apartment Stairs: Yes: Internal: 15 steps; can reach both and External: 3 steps; none; reciprocal pattern ascending and step to pattern descending Has following equipment at home:  CAM boot  OCCUPATION: physician  PLOF: Independent  PATIENT GOALS: return to soccer (twice per week for 1 hour), running (2-3 miles prior to injury), and lifting weights  NEXT MD VISIT: 04/04/23  OBJECTIVE:   PATIENT SURVEYS:  FOTO 45.26  COGNITION: Overall cognitive status: Within functional limits for tasks assessed     SENSATION: Patient reports some numbness along the dorsal surface of his right foot.   EDEMA:  Circumferential: R: 55 cm L: 54.5 cm  PALPATION: TTP: right achilles tendon and fibularis longus  JOINT MOBILITY:  Right talocrural: hypomobile and nonpainful   Right midfoot: hypomobile and nonpainful   Right subtalar: hypomobile and nonpainful   LOWER EXTREMITY ROM:  Active ROM Right eval Left eval  Hip flexion    Hip extension    Hip abduction    Hip adduction    Hip internal rotation    Hip external rotation    Knee flexion  Knee extension    Ankle dorsiflexion -20 0  Ankle plantarflexion 35 55  Ankle inversion 25 30  Ankle eversion 5 27   (Blank rows = not tested)  LOWER EXTREMITY MMT:  MMT Right eval Left eval  Hip flexion    Hip extension    Hip abduction    Hip adduction    Hip internal rotation    Hip external rotation    Knee flexion    Knee extension    Ankle dorsiflexion 4/5; "very tight"  5/5  Ankle plantarflexion    Ankle inversion 3+/5; limited by pain  5/5  Ankle eversion 3+/5 5/5   (Blank rows =  not tested)  FUNCTIONAL TESTS:  L single leg heel raise: 49 reps in 45.38 seconds  BALANCE:  R SLS on firm surface: 6 seconds  L SLS on firm surface: 30 seconds  GAIT: Assistive device utilized: None Level of assistance: Complete Independence Comments: poor weight shift onto right lower extremity and toe off on the right foot due to limited right ankle dorsiflexion   TODAY'S TREATMENT:                                                                                                                              DATE:                                     03/06/23 EXERCISE LOG  Exercise Repetitions and Resistance Comments  Nustep Lvl 3 x 15 mins   Lunges 8" box x 2 mins   Gastroc stretch     Soleus stretch     Rockerboard 2 mins   Dynadisc Dorsi/Plantarflexion; Inversion/Eversion; CW/CCW x 2 mins each   ABCs X2 reps   Marbles 10 marbles x 3 reps     Blank cell = exercise not performed today   Modalities  Date:  Vaso: Ankle, 34 degrees; low pressure, 10 mins, Pain and Edema   PATIENT EDUCATION:  Education details: POC, healing, expectations for soreness and edema, prognosis, and goals for therapy Person educated: Patient Education method: Explanation Education comprehension: verbalized understanding  HOME EXERCISE PROGRAM: [id number]  ASSESSMENT:  CLINICAL IMPRESSION: Pt arrives for today's treatment session denying any pain while sitting, but does report minimal pain when pt is up and active.  Pt doffs CAM boot and dons tennis shoe for treatment session. Pt introduced to various exercises and activities today to increase ROM and function.  Pt reports anterior, medial ankle soreness during standing rockerboard exercises today.  Pt feels slight increase in discomfort during dynadisc exercises, with pt demonstrate good technique throughout.  Lunges and ABCs added to pt's HEP.  Normal responses to vaso noted upon removal.  Pt denied any change in pain at completion of today's  treatment session.    OBJECTIVE IMPAIRMENTS: Abnormal gait, decreased activity tolerance, decreased balance, decreased endurance, decreased mobility,  difficulty walking, decreased ROM, decreased strength, hypomobility, increased edema, impaired sensation, and pain.   ACTIVITY LIMITATIONS: carrying, lifting, standing, squatting, stairs, dressing, and locomotion level  PARTICIPATION LIMITATIONS: meal prep, cleaning, laundry, shopping, and community activity  PERSONAL FACTORS: Time since onset of injury/illness/exacerbation and 1 comorbidity: allergies  are also affecting patient's functional outcome.   REHAB POTENTIAL: Good  CLINICAL DECISION MAKING: Evolving/moderate complexity  EVALUATION COMPLEXITY: Moderate   GOALS: Goals reviewed with patient? Yes  SHORT TERM GOALS: Target date: 03/22/23 Patient will be independent with his initial HEP.  Baseline: Goal status: INITIAL  2.  Patient will be able to demonstrate right ankle dorsiflexion to within 10 degrees of neutral for improved gait mechanics.  Baseline:  Goal status: INITIAL  3.  Patient will improve his single leg stance time on his right lower extremity to at least 12 seconds for improved right ankle stability.  Baseline:  Goal status: INITIAL  4.  Patient will improve his right ankle eversion to at least 18 degrees for improved function walking on uneven terrain.  Baseline:  Goal status: INITIAL  LONG TERM GOALS: Target date: 04/12/23  Patient will be independent with his advanced HEP.  Baseline:  Goal status: INITIAL  2.  Patient will improve his right ankle dorsiflexion to at least neutral for improved ankle mobility.  Baseline:  Goal status: INITIAL  3.  Patient will be able to ambulate with no significant gait deviations.  Baseline:  Goal status: INITIAL  4.  Patient will be able to navigate at least 4 steps with a reciprocal pattern for improved household mobility.  Baseline:  Goal status: INITIAL  5.   Patient will be able to demonstrate at least 30 repetitions of single leg heel raises on his right lower extremity for improved strength and endurance needed for functional activities such as running.  Baseline:  Goal status: INITIAL  6.  Patient will be able to run with no significant gait deviations.  Baseline:  Goal status: INITIAL   PLAN:  PT FREQUENCY: 2x/week  PT DURATION: 6 weeks  PLANNED INTERVENTIONS: Therapeutic exercises, Therapeutic activity, Neuromuscular re-education, Balance training, Gait training, Patient/Family education, Self Care, Joint mobilization, Stair training, Electrical stimulation, Cryotherapy, Moist heat, Vasopneumatic device, Manual therapy, and Re-evaluation  PLAN FOR NEXT SESSION: Nustep, review and update HEP as needed, right ankle AROM and PROM, gait training (wean out of CAM boot, as able), and modalities as needed   Newman Pies, PTA 03/06/2023, 12:04 PM

## 2023-03-08 ENCOUNTER — Ambulatory Visit: Payer: 59

## 2023-03-08 DIAGNOSIS — M25671 Stiffness of right ankle, not elsewhere classified: Secondary | ICD-10-CM

## 2023-03-08 DIAGNOSIS — M25571 Pain in right ankle and joints of right foot: Secondary | ICD-10-CM

## 2023-03-08 NOTE — Therapy (Signed)
OUTPATIENT PHYSICAL THERAPY LOWER EXTREMITY TREATMENT   Patient Name: Dean Cross MRN: [id number] DOB:Jun 20, 1981, 42 y.o., male Today's Date: 03/08/2023  END OF SESSION:  PT End of Session - 03/08/23 1647     Visit Number 3    Number of Visits 12    Date for PT Re-Evaluation 05/25/23    PT Start Time 1645    PT Stop Time 1727    PT Time Calculation (min) 42 min    Activity Tolerance Patient tolerated treatment well    Behavior During Therapy WFL for tasks assessed/performed              Past Medical History:  Diagnosis Date   Allergy    celiac's and seasonal   Celiac disease    GERD (gastroesophageal reflux disease)    Past Surgical History:  Procedure Laterality Date   APPENDECTOMY     1992   FINGER AMPUTATION  2000   HERNIA REPAIR  2011   b/l inguinal hernia    ORIF ANKLE FRACTURE Right 01/16/2023   Procedure: OPEN REDUCTION INTERNAL FIXATION (ORIF) ANKLE FRACTURE;  Surgeon: Sheral Apley, MD;  Location: [facility];  Service: Orthopedics;  Laterality: Right;   VASECTOMY  2018   Patient Active Problem List   Diagnosis Date Noted   Closed displaced trimalleolar fracture of right ankle 01/16/2023   Dyslipidemia 11/12/2019   GERD (gastroesophageal reflux disease) 08/06/2018   Seasonal allergies 08/06/2018   Celiac disease 12/09/2015   Family history of polycystic kidney disease 08/03/2015    PCP: Ardith Dark, MD  REFERRING PROVIDER: Sheral Apley, MD  REFERRING DIAG: s/p right ankle trimalleolar fracture ORIF 01/16/23  THERAPY DIAG:  Stiffness of right ankle, not elsewhere classified  Pain in right ankle and joints of right foot  Rationale for Evaluation and Treatment: Rehabilitation  ONSET DATE: 01/16/23  SUBJECTIVE:   SUBJECTIVE STATEMENT: Patient reports that he is not hurting, but is just stiff.   PERTINENT HISTORY: Allergies PAIN:  Are you having pain? Yes: NPRS scale: 0/10 Pain location: right ankle  Pain  description: "twinge"  Aggravating factors: certain ankle movements Relieving factors: medication   PRECAUTIONS: None  RED FLAGS: None   WEIGHT BEARING RESTRICTIONS: No  FALLS:  Has patient fallen in last 6 months? No  LIVING ENVIRONMENT: Lives with: lives with their family Lives in: House/apartment Stairs: Yes: Internal: 15 steps; can reach both and External: 3 steps; none; reciprocal pattern ascending and step to pattern descending Has following equipment at home:  CAM boot  OCCUPATION: physician  PLOF: Independent  PATIENT GOALS: return to soccer (twice per week for 1 hour), running (2-3 miles prior to injury), and lifting weights  NEXT MD VISIT: 04/04/23  OBJECTIVE:   PATIENT SURVEYS:  FOTO 45.26  COGNITION: Overall cognitive status: Within functional limits for tasks assessed     SENSATION: Patient reports some numbness along the dorsal surface of his right foot.   EDEMA:  Circumferential: R: 55 cm L: 54.5 cm  PALPATION: TTP: right achilles tendon and fibularis longus  JOINT MOBILITY:  Right talocrural: hypomobile and nonpainful   Right midfoot: hypomobile and nonpainful   Right subtalar: hypomobile and nonpainful   LOWER EXTREMITY ROM:  Active ROM Right eval Left eval  Hip flexion    Hip extension    Hip abduction    Hip adduction    Hip internal rotation    Hip external rotation    Knee flexion    Knee extension  Ankle dorsiflexion -20 0  Ankle plantarflexion 35 55  Ankle inversion 25 30  Ankle eversion 5 27   (Blank rows = not tested)  LOWER EXTREMITY MMT:  MMT Right eval Left eval  Hip flexion    Hip extension    Hip abduction    Hip adduction    Hip internal rotation    Hip external rotation    Knee flexion    Knee extension    Ankle dorsiflexion 4/5; "very tight"  5/5  Ankle plantarflexion    Ankle inversion 3+/5; limited by pain  5/5  Ankle eversion 3+/5 5/5   (Blank rows = not tested)  FUNCTIONAL TESTS:  L single  leg heel raise: 49 reps in 45.38 seconds  BALANCE:  R SLS on firm surface: 6 seconds  L SLS on firm surface: 30 seconds  GAIT: Assistive device utilized: None Level of assistance: Complete Independence Comments: poor weight shift onto right lower extremity and toe off on the right foot due to limited right ankle dorsiflexion   TODAY'S TREATMENT:                                                                                                                              DATE:                                     03/08/23 EXERCISE LOG  Exercise Repetitions and Resistance Comments  Rocker board  6 minutes   Lunges onto step  6" step x 2.5 minutes   R foot on step   BAPS circles  L3 x 30 reps each   CW and CCW  Anterior tibialis stretch  2.5 minutes   Tandem on foam  2 x 1 minutes  Rare UE support  Recumbent bike  L5 x 14 minutes   ABC's  1 rep     Blank cell = exercise not performed today                                    03/06/23 EXERCISE LOG  Exercise Repetitions and Resistance Comments  Nustep Lvl 3 x 15 mins   Lunges 8" box x 2 mins   Gastroc stretch     Soleus stretch     Rockerboard 2 mins   Dynadisc Dorsi/Plantarflexion; Inversion/Eversion; CW/CCW x 2 mins each   ABCs X2 reps   Marbles 10 marbles x 3 reps     Blank cell = exercise not performed today   Modalities  Date:  Vaso: Ankle, 34 degrees; low pressure, 10 mins, Pain and Edema   PATIENT EDUCATION:  Education details: POC, healing, expectations for soreness and edema, prognosis, and goals for therapy Person educated: Patient Education method: Explanation Education comprehension: verbalized understanding  HOME EXERCISE PROGRAM: [id number]  ASSESSMENT:  CLINICAL IMPRESSION: Patient was progressed with multiple new standing interventions for improved ankle mobility. He required minimal cueing with BAPS circles to prevent hip internal and external range of motion to isolate ankle mobility. He experienced no  significant increase in pain or discomfort with any of today's interventions. He reported feeling good upon the conclusion of treatment. He continues to require skilled physical therapy to address his remaining impairments to return to his prior level of function.   OBJECTIVE IMPAIRMENTS: Abnormal gait, decreased activity tolerance, decreased balance, decreased endurance, decreased mobility, difficulty walking, decreased ROM, decreased strength, hypomobility, increased edema, impaired sensation, and pain.   ACTIVITY LIMITATIONS: carrying, lifting, standing, squatting, stairs, dressing, and locomotion level  PARTICIPATION LIMITATIONS: meal prep, cleaning, laundry, shopping, and community activity  PERSONAL FACTORS: Time since onset of injury/illness/exacerbation and 1 comorbidity: allergies  are also affecting patient's functional outcome.   REHAB POTENTIAL: Good  CLINICAL DECISION MAKING: Evolving/moderate complexity  EVALUATION COMPLEXITY: Moderate   GOALS: Goals reviewed with patient? Yes  SHORT TERM GOALS: Target date: 03/22/23 Patient will be independent with his initial HEP.  Baseline: Goal status: INITIAL  2.  Patient will be able to demonstrate right ankle dorsiflexion to within 10 degrees of neutral for improved gait mechanics.  Baseline:  Goal status: INITIAL  3.  Patient will improve his single leg stance time on his right lower extremity to at least 12 seconds for improved right ankle stability.  Baseline:  Goal status: INITIAL  4.  Patient will improve his right ankle eversion to at least 18 degrees for improved function walking on uneven terrain.  Baseline:  Goal status: INITIAL  LONG TERM GOALS: Target date: 04/12/23  Patient will be independent with his advanced HEP.  Baseline:  Goal status: INITIAL  2.  Patient will improve his right ankle dorsiflexion to at least neutral for improved ankle mobility.  Baseline:  Goal status: INITIAL  3.  Patient will be  able to ambulate with no significant gait deviations.  Baseline:  Goal status: INITIAL  4.  Patient will be able to navigate at least 4 steps with a reciprocal pattern for improved household mobility.  Baseline:  Goal status: INITIAL  5.  Patient will be able to demonstrate at least 30 repetitions of single leg heel raises on his right lower extremity for improved strength and endurance needed for functional activities such as running.  Baseline:  Goal status: INITIAL  6.  Patient will be able to run with no significant gait deviations.  Baseline:  Goal status: INITIAL   PLAN:  PT FREQUENCY: 2x/week  PT DURATION: 6 weeks  PLANNED INTERVENTIONS: Therapeutic exercises, Therapeutic activity, Neuromuscular re-education, Balance training, Gait training, Patient/Family education, Self Care, Joint mobilization, Stair training, Electrical stimulation, Cryotherapy, Moist heat, Vasopneumatic device, Manual therapy, and Re-evaluation  PLAN FOR NEXT SESSION: Nustep, review and update HEP as needed, right ankle AROM and PROM, gait training (wean out of CAM boot, as able), and modalities as needed   Granville Lewis, PT 03/08/2023, 5:48 PM

## 2023-03-13 ENCOUNTER — Ambulatory Visit: Payer: 59

## 2023-03-13 DIAGNOSIS — M25671 Stiffness of right ankle, not elsewhere classified: Secondary | ICD-10-CM

## 2023-03-13 DIAGNOSIS — M25571 Pain in right ankle and joints of right foot: Secondary | ICD-10-CM | POA: Diagnosis not present

## 2023-03-13 NOTE — Therapy (Signed)
OUTPATIENT PHYSICAL THERAPY LOWER EXTREMITY TREATMENT   Patient Name: Dean Cross MRN: [id number] DOB:[birth year], 42 y.o., male Today's Date: 03/13/2023  END OF SESSION:  PT End of Session - 03/13/23 1110     Visit Number 4    Number of Visits 12    Date for PT Re-Evaluation 05/25/23    PT Start Time 1100    PT Stop Time 1155    PT Time Calculation (min) 55 min    Activity Tolerance Patient tolerated treatment well    Behavior During Therapy WFL for tasks assessed/performed               Past Medical History:  Diagnosis Date   Allergy    celiac's and seasonal   Celiac disease    GERD (gastroesophageal reflux disease)    Past Surgical History:  Procedure Laterality Date   APPENDECTOMY     1992   FINGER AMPUTATION  2000   HERNIA REPAIR  2011   b/l inguinal hernia    ORIF ANKLE FRACTURE Right 01/16/2023   Procedure: OPEN REDUCTION INTERNAL FIXATION (ORIF) ANKLE FRACTURE;  Surgeon: Sheral Apley, MD;  Location: [facility];  Service: Orthopedics;  Laterality: Right;   VASECTOMY  2018   Patient Active Problem List   Diagnosis Date Noted   Closed displaced trimalleolar fracture of right ankle 01/16/2023   Dyslipidemia 11/12/2019   GERD (gastroesophageal reflux disease) 08/06/2018   Seasonal allergies 08/06/2018   Celiac disease 12/09/2015   Family history of polycystic kidney disease 08/03/2015    PCP: Ardith Dark, MD  REFERRING PROVIDER: Sheral Apley, MD  REFERRING DIAG: s/p right ankle trimalleolar fracture ORIF 01/16/23  THERAPY DIAG:  Stiffness of right ankle, not elsewhere classified  Pain in right ankle and joints of right foot  Rationale for Evaluation and Treatment: Rehabilitation  ONSET DATE: 01/16/23  SUBJECTIVE:   SUBJECTIVE STATEMENT: Patient reports that his ankle is a little stiff today. He has been having some popping in his foot, but it is not painful.    PERTINENT HISTORY: Allergies PAIN:  Are you  having pain? Yes: NPRS scale: 0/10 Pain location: right ankle  Pain description: "twinge"  Aggravating factors: certain ankle movements Relieving factors: medication   PRECAUTIONS: None  RED FLAGS: None   WEIGHT BEARING RESTRICTIONS: No  FALLS:  Has patient fallen in last 6 months? No  LIVING ENVIRONMENT: Lives with: lives with their family Lives in: House/apartment Stairs: Yes: Internal: 15 steps; can reach both and External: 3 steps; none; reciprocal pattern ascending and step to pattern descending Has following equipment at home:  CAM boot  OCCUPATION: physician  PLOF: Independent  PATIENT GOALS: return to soccer (twice per week for 1 hour), running (2-3 miles prior to injury), and lifting weights  NEXT MD VISIT: 04/04/23  OBJECTIVE:   PATIENT SURVEYS:  FOTO 45.26  COGNITION: Overall cognitive status: Within functional limits for tasks assessed     SENSATION: Patient reports some numbness along the dorsal surface of his right foot.   EDEMA:  Circumferential: R: 55 cm L: 54.5 cm  PALPATION: TTP: right achilles tendon and fibularis longus  JOINT MOBILITY:  Right talocrural: hypomobile and nonpainful   Right midfoot: hypomobile and nonpainful   Right subtalar: hypomobile and nonpainful   LOWER EXTREMITY ROM:  Active ROM Right eval Left eval  Hip flexion    Hip extension    Hip abduction    Hip adduction    Hip internal rotation  Hip external rotation    Knee flexion    Knee extension    Ankle dorsiflexion -20 0  Ankle plantarflexion 35 55  Ankle inversion 25 30  Ankle eversion 5 27   (Blank rows = not tested)  LOWER EXTREMITY MMT:  MMT Right eval Left eval  Hip flexion    Hip extension    Hip abduction    Hip adduction    Hip internal rotation    Hip external rotation    Knee flexion    Knee extension    Ankle dorsiflexion 4/5; "very tight"  5/5  Ankle plantarflexion    Ankle inversion 3+/5; limited by pain  5/5  Ankle eversion  3+/5 5/5   (Blank rows = not tested)  FUNCTIONAL TESTS:  L single leg heel raise: 49 reps in 45.38 seconds  BALANCE:  R SLS on firm surface: 6 seconds  L SLS on firm surface: 30 seconds  GAIT: Assistive device utilized: None Level of assistance: Complete Independence Comments: poor weight shift onto right lower extremity and toe off on the right foot due to limited right ankle dorsiflexion   TODAY'S TREATMENT:                                                                                                                              DATE:                                     03/13/23 EXERCISE LOG  Exercise Repetitions and Resistance Comments  Recumbent bike  L5 x 14 minutes    Gastroc stretch  2 minutes   Soleus stretch  2 minutes    Marching on BOSU 2 minutes  Intermittent UE support   Rocker board (lateral)  2.5 minutes   DL heel raise w/ SL eccentric lower  25 reps    ABC 2 reps    Marble pickup 11 marbles x 4 reps    Blank cell = exercise not performed today  Modalities  Date:  Vaso: Ankle, 34 degrees; low pressure, 10 mins, Edema                                   03/08/23 EXERCISE LOG  Exercise Repetitions and Resistance Comments  Rocker board  6 minutes   Lunges onto step  6" step x 2.5 minutes   R foot on step   BAPS circles  L3 x 30 reps each   CW and CCW  Anterior tibialis stretch  2.5 minutes   Tandem on foam  2 x 1 minutes  Rare UE support  Recumbent bike  L5 x 14 minutes   ABC's  1 rep     Blank cell = exercise not performed today  03/06/23 EXERCISE LOG  Exercise Repetitions and Resistance Comments  Nustep Lvl 3 x 15 mins   Lunges 8" box x 2 mins   Gastroc stretch     Soleus stretch     Rockerboard 2 mins   Dynadisc Dorsi/Plantarflexion; Inversion/Eversion; CW/CCW x 2 mins each   ABCs X2 reps   Marbles 10 marbles x 3 reps     Blank cell = exercise not performed today   Modalities  Date:  Vaso: Ankle, 34 degrees;  low pressure, 10 mins, Pain and Edema   PATIENT EDUCATION:  Education details: POC, healing, expectations for soreness and edema, prognosis, and goals for therapy Person educated: Patient Education method: Explanation Education comprehension: verbalized understanding  HOME EXERCISE PROGRAM: [id number]  ASSESSMENT:  CLINICAL IMPRESSION: Patient was progressed with multiple familiar interventions for improved ankle mobility and stability with moderate difficulty. He required minimal cueing with today's interventions for proper exercise performance. He experienced no significant increase in pain or discomfort with any of today's interventions. He reported that his ankle felt good upon the conclusion of treatment. He continues to require skilled physical therapy to address his remaining impairments to return to his prior level of function.   OBJECTIVE IMPAIRMENTS: Abnormal gait, decreased activity tolerance, decreased balance, decreased endurance, decreased mobility, difficulty walking, decreased ROM, decreased strength, hypomobility, increased edema, impaired sensation, and pain.   ACTIVITY LIMITATIONS: carrying, lifting, standing, squatting, stairs, dressing, and locomotion level  PARTICIPATION LIMITATIONS: meal prep, cleaning, laundry, shopping, and community activity  PERSONAL FACTORS: Time since onset of injury/illness/exacerbation and 1 comorbidity: allergies  are also affecting patient's functional outcome.   REHAB POTENTIAL: Good  CLINICAL DECISION MAKING: Evolving/moderate complexity  EVALUATION COMPLEXITY: Moderate   GOALS: Goals reviewed with patient? Yes  SHORT TERM GOALS: Target date: 03/22/23 Patient will be independent with his initial HEP.  Baseline: Goal status: INITIAL  2.  Patient will be able to demonstrate right ankle dorsiflexion to within 10 degrees of neutral for improved gait mechanics.  Baseline:  Goal status: INITIAL  3.  Patient will improve his single  leg stance time on his right lower extremity to at least 12 seconds for improved right ankle stability.  Baseline:  Goal status: INITIAL  4.  Patient will improve his right ankle eversion to at least 18 degrees for improved function walking on uneven terrain.  Baseline:  Goal status: INITIAL  LONG TERM GOALS: Target date: 04/12/23  Patient will be independent with his advanced HEP.  Baseline:  Goal status: INITIAL  2.  Patient will improve his right ankle dorsiflexion to at least neutral for improved ankle mobility.  Baseline:  Goal status: INITIAL  3.  Patient will be able to ambulate with no significant gait deviations.  Baseline:  Goal status: INITIAL  4.  Patient will be able to navigate at least 4 steps with a reciprocal pattern for improved household mobility.  Baseline:  Goal status: INITIAL  5.  Patient will be able to demonstrate at least 30 repetitions of single leg heel raises on his right lower extremity for improved strength and endurance needed for functional activities such as running.  Baseline:  Goal status: INITIAL  6.  Patient will be able to run with no significant gait deviations.  Baseline:  Goal status: INITIAL   PLAN:  PT FREQUENCY: 2x/week  PT DURATION: 6 weeks  PLANNED INTERVENTIONS: Therapeutic exercises, Therapeutic activity, Neuromuscular re-education, Balance training, Gait training, Patient/Family education, Self Care, Joint mobilization, Stair training, Electrical stimulation, Cryotherapy, Moist heat,  Vasopneumatic device, Manual therapy, and Re-evaluation  PLAN FOR NEXT SESSION: Nustep, review and update HEP as needed, right ankle AROM and PROM, gait training (wean out of CAM boot, as able), and modalities as needed   Granville Lewis, PT 03/13/2023, 1:11 PM

## 2023-03-15 ENCOUNTER — Ambulatory Visit: Payer: 59

## 2023-03-15 DIAGNOSIS — M25671 Stiffness of right ankle, not elsewhere classified: Secondary | ICD-10-CM

## 2023-03-15 DIAGNOSIS — M25571 Pain in right ankle and joints of right foot: Secondary | ICD-10-CM | POA: Diagnosis not present

## 2023-03-15 NOTE — Therapy (Signed)
OUTPATIENT PHYSICAL THERAPY LOWER EXTREMITY TREATMENT   Patient Name: Dean Cross MRN: [id number] DOB:08-12-1980, 42 y.o., male Today's Date: 03/15/2023  END OF SESSION:  PT End of Session - 03/15/23 1648     Visit Number 5    Number of Visits 12    Date for PT Re-Evaluation 05/25/23    PT Start Time 1645    PT Stop Time 1743    PT Time Calculation (min) 58 min    Activity Tolerance Patient tolerated treatment well    Behavior During Therapy WFL for tasks assessed/performed               Past Medical History:  Diagnosis Date   Allergy    celiac's and seasonal   Celiac disease    GERD (gastroesophageal reflux disease)    Past Surgical History:  Procedure Laterality Date   APPENDECTOMY     1992   FINGER AMPUTATION  2000   HERNIA REPAIR  2011   b/l inguinal hernia    ORIF ANKLE FRACTURE Right 01/16/2023   Procedure: OPEN REDUCTION INTERNAL FIXATION (ORIF) ANKLE FRACTURE;  Surgeon: Sheral Apley, MD;  Location: [facility];  Service: Orthopedics;  Laterality: Right;   VASECTOMY  2018   Patient Active Problem List   Diagnosis Date Noted   Closed displaced trimalleolar fracture of right ankle 01/16/2023   Dyslipidemia 11/12/2019   GERD (gastroesophageal reflux disease) 08/06/2018   Seasonal allergies 08/06/2018   Celiac disease 12/09/2015   Family history of polycystic kidney disease 08/03/2015    PCP: Ardith Dark, MD  REFERRING PROVIDER: Sheral Apley, MD  REFERRING DIAG: s/p right ankle trimalleolar fracture ORIF 01/16/23  THERAPY DIAG:  Stiffness of right ankle, not elsewhere classified  Pain in right ankle and joints of right foot  Rationale for Evaluation and Treatment: Rehabilitation  ONSET DATE: 01/16/23  SUBJECTIVE:   SUBJECTIVE STATEMENT: Pt reports 2-3/10 right ankle soreness.  Pt reports not wearing his CAM boot at work yesterday with minimal swelling and soreness at the end of the day.    PERTINENT  HISTORY: Allergies PAIN:  Are you having pain? Yes: NPRS scale: 2-3/10 Pain location: right ankle  Pain description: "twinge"  Aggravating factors: certain ankle movements Relieving factors: medication   PRECAUTIONS: None  RED FLAGS: None   WEIGHT BEARING RESTRICTIONS: No  FALLS:  Has patient fallen in last 6 months? No  LIVING ENVIRONMENT: Lives with: lives with their family Lives in: House/apartment Stairs: Yes: Internal: 15 steps; can reach both and External: 3 steps; none; reciprocal pattern ascending and step to pattern descending Has following equipment at home:  CAM boot  OCCUPATION: physician  PLOF: Independent  PATIENT GOALS: return to soccer (twice per week for 1 hour), running (2-3 miles prior to injury), and lifting weights  NEXT MD VISIT: 04/04/23  OBJECTIVE:   PATIENT SURVEYS:  FOTO 45.26  COGNITION: Overall cognitive status: Within functional limits for tasks assessed     SENSATION: Patient reports some numbness along the dorsal surface of his right foot.   EDEMA:  Circumferential: R: 55 cm L: 54.5 cm  PALPATION: TTP: right achilles tendon and fibularis longus  JOINT MOBILITY:  Right talocrural: hypomobile and nonpainful   Right midfoot: hypomobile and nonpainful   Right subtalar: hypomobile and nonpainful   LOWER EXTREMITY ROM:  Active ROM Right eval Left eval  Hip flexion    Hip extension    Hip abduction    Hip adduction  Hip internal rotation    Hip external rotation    Knee flexion    Knee extension    Ankle dorsiflexion -20 0  Ankle plantarflexion 35 55  Ankle inversion 25 30  Ankle eversion 5 27   (Blank rows = not tested)  LOWER EXTREMITY MMT:  MMT Right eval Left eval  Hip flexion    Hip extension    Hip abduction    Hip adduction    Hip internal rotation    Hip external rotation    Knee flexion    Knee extension    Ankle dorsiflexion 4/5; "very tight"  5/5  Ankle plantarflexion    Ankle inversion 3+/5;  limited by pain  5/5  Ankle eversion 3+/5 5/5   (Blank rows = not tested)  FUNCTIONAL TESTS:  L single leg heel raise: 49 reps in 45.38 seconds  BALANCE:  R SLS on firm surface: 6 seconds  L SLS on firm surface: 30 seconds  GAIT: Assistive device utilized: None Level of assistance: Complete Independence Comments: poor weight shift onto right lower extremity and toe off on the right foot due to limited right ankle dorsiflexion   TODAY'S TREATMENT:                                                                                                                              DATE:                                     03/15/23 EXERCISE LOG  Exercise Repetitions and Resistance Comments  Recumbent bike  L5 x 15 minutes    Gastroc stretch  2 minutes   Soleus stretch  2 minutes    Marching on BOSU 3 minutes  Intermittent UE support   Rocker board (lateral)  2.5 minutes   BAPS CW and CCW L3 x2 mins   Plantar/dorsiflexion Red x 20 reps each   Inversion/eversion Red x 20 reps each   ABC    Marble pickup     Blank cell = exercise not performed today  Modalities  Date:  Vaso: Ankle, 34 degrees; low pressure, 10 mins, Edema                                   03/08/23 EXERCISE LOG  Exercise Repetitions and Resistance Comments  Rocker board  6 minutes   Lunges onto step  6" step x 2.5 minutes   R foot on step   BAPS circles  L3 x 30 reps each   CW and CCW  Anterior tibialis stretch  2.5 minutes   Tandem on foam  2 x 1 minutes  Rare UE support  Recumbent bike  L5 x 14 minutes   ABC's  1 rep     Blank cell = exercise not  performed today     PATIENT EDUCATION:  Education details: POC, healing, expectations for soreness and edema, prognosis, and goals for therapy Person educated: Patient Education method: Explanation Education comprehension: verbalized understanding  HOME EXERCISE PROGRAM: [id number]  ASSESSMENT:  CLINICAL IMPRESSION: Pt arrives for today's treatment session  reporting 2-3/10 right ankle pain.  Pt reports that he has not worn his CAM boot at work the past two days and has only experienced mild soreness and swelling at the end of the day.  Pt introduced to resisted dorsiflexion, plantarflexion, inversion, and eversion today with min cues for proper technique.  Pt given red theraband for home use.  Normal responses to vaso noted upon removal.  Pt denied any pain at completion of today's treatment session.  OBJECTIVE IMPAIRMENTS: Abnormal gait, decreased activity tolerance, decreased balance, decreased endurance, decreased mobility, difficulty walking, decreased ROM, decreased strength, hypomobility, increased edema, impaired sensation, and pain.   ACTIVITY LIMITATIONS: carrying, lifting, standing, squatting, stairs, dressing, and locomotion level  PARTICIPATION LIMITATIONS: meal prep, cleaning, laundry, shopping, and community activity  PERSONAL FACTORS: Time since onset of injury/illness/exacerbation and 1 comorbidity: allergies  are also affecting patient's functional outcome.   REHAB POTENTIAL: Good  CLINICAL DECISION MAKING: Evolving/moderate complexity  EVALUATION COMPLEXITY: Moderate   GOALS: Goals reviewed with patient? Yes  SHORT TERM GOALS: Target date: 03/22/23 Patient will be independent with his initial HEP.  Baseline: Goal status: MET  2.  Patient will be able to demonstrate right ankle dorsiflexion to within 10 degrees of neutral for improved gait mechanics.  Baseline:  Goal status: INITIAL  3.  Patient will improve his single leg stance time on his right lower extremity to at least 12 seconds for improved right ankle stability.  Baseline:  Goal status: INITIAL  4.  Patient will improve his right ankle eversion to at least 18 degrees for improved function walking on uneven terrain.  Baseline:  Goal status: INITIAL  LONG TERM GOALS: Target date: 04/12/23  Patient will be independent with his advanced HEP.  Baseline:   Goal status: INITIAL  2.  Patient will improve his right ankle dorsiflexion to at least neutral for improved ankle mobility.  Baseline:  Goal status: INITIAL  3.  Patient will be able to ambulate with no significant gait deviations.  Baseline:  Goal status: INITIAL  4.  Patient will be able to navigate at least 4 steps with a reciprocal pattern for improved household mobility.  Baseline:  Goal status: INITIAL  5.  Patient will be able to demonstrate at least 30 repetitions of single leg heel raises on his right lower extremity for improved strength and endurance needed for functional activities such as running.  Baseline:  Goal status: INITIAL  6.  Patient will be able to run with no significant gait deviations.  Baseline:  Goal status: INITIAL   PLAN:  PT FREQUENCY: 2x/week  PT DURATION: 6 weeks  PLANNED INTERVENTIONS: Therapeutic exercises, Therapeutic activity, Neuromuscular re-education, Balance training, Gait training, Patient/Family education, Self Care, Joint mobilization, Stair training, Electrical stimulation, Cryotherapy, Moist heat, Vasopneumatic device, Manual therapy, and Re-evaluation  PLAN FOR NEXT SESSION: Nustep, review and update HEP as needed, right ankle AROM and PROM, gait training (wean out of CAM boot, as able), and modalities as needed   Newman Pies, PTA 03/15/2023, 5:51 PM

## 2023-03-20 ENCOUNTER — Ambulatory Visit: Payer: 59

## 2023-03-20 DIAGNOSIS — M25571 Pain in right ankle and joints of right foot: Secondary | ICD-10-CM | POA: Diagnosis not present

## 2023-03-20 DIAGNOSIS — M25671 Stiffness of right ankle, not elsewhere classified: Secondary | ICD-10-CM

## 2023-03-20 NOTE — Therapy (Signed)
OUTPATIENT PHYSICAL THERAPY LOWER EXTREMITY TREATMENT   Patient Name: Dean Cross MRN: [id number] DOB:May 29, 1981, 42 y.o., male Today's Date: 03/20/2023  END OF SESSION:  PT End of Session - 03/20/23 1103     Visit Number 6    Number of Visits 12    Date for PT Re-Evaluation 05/25/23    PT Start Time 1100    PT Stop Time 1153    PT Time Calculation (min) 53 min    Activity Tolerance Patient tolerated treatment well    Behavior During Therapy WFL for tasks assessed/performed               Past Medical History:  Diagnosis Date   Allergy    celiac's and seasonal   Celiac disease    GERD (gastroesophageal reflux disease)    Past Surgical History:  Procedure Laterality Date   APPENDECTOMY     1992   FINGER AMPUTATION  2000   HERNIA REPAIR  2011   b/l inguinal hernia    ORIF ANKLE FRACTURE Right 01/16/2023   Procedure: OPEN REDUCTION INTERNAL FIXATION (ORIF) ANKLE FRACTURE;  Surgeon: Sheral Apley, MD;  Location: [facility];  Service: Orthopedics;  Laterality: Right;   VASECTOMY  2018   Patient Active Problem List   Diagnosis Date Noted   Closed displaced trimalleolar fracture of right ankle 01/16/2023   Dyslipidemia 11/12/2019   GERD (gastroesophageal reflux disease) 08/06/2018   Seasonal allergies 08/06/2018   Celiac disease 12/09/2015   Family history of polycystic kidney disease 08/03/2015    PCP: Ardith Dark, MD  REFERRING PROVIDER: Sheral Apley, MD  REFERRING DIAG: s/p right ankle trimalleolar fracture ORIF 01/16/23  THERAPY DIAG:  Stiffness of right ankle, not elsewhere classified  Pain in right ankle and joints of right foot  Rationale for Evaluation and Treatment: Rehabilitation  ONSET DATE: 01/16/23  SUBJECTIVE:   SUBJECTIVE STATEMENT: Pt reports 2/10 right ankle pain today with swelling.  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  PERTINENT  HISTORY: Allergies PAIN:  Are you having pain? Yes: NPRS scale: 2/10 Pain location: right ankle  Pain description: "twinge"  Aggravating factors: certain ankle movements Relieving factors: medication   PRECAUTIONS: None  RED FLAGS: None   WEIGHT BEARING RESTRICTIONS: No  FALLS:  Has patient fallen in last 6 months? No  LIVING ENVIRONMENT: Lives with: lives with their family Lives in: House/apartment Stairs: Yes: Internal: 15 steps; can reach both and External: 3 steps; none; reciprocal pattern ascending and step to pattern descending Has following equipment at home:  CAM boot  OCCUPATION: physician  PLOF: Independent  PATIENT GOALS: return to soccer (twice per week for 1 hour), running (2-3 miles prior to injury), and lifting weights  NEXT MD VISIT: 04/04/23  OBJECTIVE:   PATIENT SURVEYS:  FOTO 45.26  COGNITION: Overall cognitive status: Within functional limits for tasks assessed     SENSATION: Patient reports some numbness along the dorsal surface of his right foot.   EDEMA:  Circumferential: R: 55 cm L: 54.5 cm  PALPATION: TTP: right achilles tendon and fibularis longus  JOINT MOBILITY:  Right talocrural: hypomobile and nonpainful   Right midfoot: hypomobile and nonpainful   Right subtalar: hypomobile and nonpainful   LOWER EXTREMITY ROM:  Active ROM Right eval Left eval  Hip flexion    Hip extension    Hip abduction    Hip adduction    Hip  internal rotation    Hip external rotation    Knee flexion    Knee extension    Ankle dorsiflexion -20 0  Ankle plantarflexion 35 55  Ankle inversion 25 30  Ankle eversion 5 27   (Blank rows = not tested)  LOWER EXTREMITY MMT:  MMT Right eval Left eval  Hip flexion    Hip extension    Hip abduction    Hip adduction    Hip internal rotation    Hip external rotation    Knee flexion    Knee extension    Ankle dorsiflexion 4/5; "very tight"  5/5  Ankle plantarflexion    Ankle inversion 3+/5;  limited by pain  5/5  Ankle eversion 3+/5 5/5   (Blank rows = not tested)  FUNCTIONAL TESTS:  L single leg heel raise: 49 reps in 45.38 seconds  BALANCE:  R SLS on firm surface: 6 seconds  L SLS on firm surface: 30 seconds  GAIT: Assistive device utilized: None Level of assistance: Complete Independence Comments: poor weight shift onto right lower extremity and toe off on the right foot due to limited right ankle dorsiflexion   TODAY'S TREATMENT:                                                                                                                              DATE:                                     03/20/23 EXERCISE LOG  Exercise Repetitions and Resistance Comments  Recumbent bike  L5 x 9 minutes    Elliptical  L3/L3 4 mins forward/4 mins backward   Gastroc stretch with 2" step 4 reps x 30 secs   Soleus stretch     Squats BOSU ball down x 20 reps Intermittent UE support   Rocker board (lateral)  3 minutes   SLS with 5 cones 2 reps   Plantar/dorsiflexion    Inversion/eversion    ABC    Marble pickup     Blank cell = exercise not performed today  Modalities  Date:  Vaso: Ankle, 34 degrees; low pressure, 10 mins, Edema                                   03/08/23 EXERCISE LOG  Exercise Repetitions and Resistance Comments  Rocker board  6 minutes   Lunges onto step  6" step x 2.5 minutes   R foot on step   BAPS circles  L3 x 30 reps each   CW and CCW  Anterior tibialis stretch  2.5 minutes   Tandem on foam  2 x 1 minutes  Rare UE support  Recumbent bike  L5 x 14 minutes   ABC's  1 rep     Blank  cell = exercise not performed today     PATIENT EDUCATION:  Education details: POC, healing, expectations for soreness and edema, prognosis, and goals for therapy Person educated: Patient Education method: Explanation Education comprehension: verbalized understanding  HOME EXERCISE PROGRAM: [id number]  ASSESSMENT:  CLINICAL IMPRESSION: Pt arrives for today's  treatment session reporting 2/10 right ankle pain.  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  Pt able to tolerate introduction to elliptical today with minimal discomfort.  Pt challenged by all single leg activities today with pt able to demonstrate 18 single leg heel raises making good progress towards his goal.  Pt able to demonstrate 5 degrees of right ankle dorsiflexion and 24 degrees of eversion.  Pt able to ambulate without significant gait deviations throughout the day, but as time on his feet increases so does swelling and gait deviations.  Normal responses to vaso noted upon removal.  Pt denied any pain at completion of today's treatment session.  OBJECTIVE IMPAIRMENTS: Abnormal gait, decreased activity tolerance, decreased balance, decreased endurance, decreased mobility, difficulty walking, decreased ROM, decreased strength, hypomobility, increased edema, impaired sensation, and pain.   ACTIVITY LIMITATIONS: carrying, lifting, standing, squatting, stairs, dressing, and locomotion level  PARTICIPATION LIMITATIONS: meal prep, cleaning, laundry, shopping, and community activity  PERSONAL FACTORS: Time since onset of injury/illness/exacerbation and 1 comorbidity: allergies  are also affecting patient's functional outcome.   REHAB POTENTIAL: Good  CLINICAL DECISION MAKING: Evolving/moderate complexity  EVALUATION COMPLEXITY: Moderate   GOALS: Goals reviewed with patient? Yes  SHORT TERM GOALS: Target date: 03/22/23 Patient will be independent with his initial HEP.  Baseline: Goal status: MET  2.  Patient will be able to demonstrate right ankle dorsiflexion to within 10 degrees of neutral for improved gait mechanics.  Baseline: 5 degrees of dorsiflexion Goal status: MET  3.  Patient will improve his single leg stance time on his right lower extremity to at least 12  seconds for improved right ankle stability.  Baseline: 16.67 seconds Goal status: MET  4.  Patient will improve his right ankle eversion to at least 18 degrees for improved function walking on uneven terrain.  Baseline: 24 degress Goal status: MET  LONG TERM GOALS: Target date: 04/12/23  Patient will be independent with his advanced HEP.  Baseline:  Goal status: IN PROGRESS  2.  Patient will improve his right ankle dorsiflexion to at least neutral for improved ankle mobility.  Baseline:  Goal status: MET  3.  Patient will be able to ambulate with no significant gait deviations.  Baseline:  Goal status: PARTIALLY MET  4.  Patient will be able to navigate at least 4 steps with a reciprocal pattern for improved household mobility.  Baseline:  Goal status: IN PROGRESS  5.  Patient will be able to demonstrate at least 30 repetitions of single leg heel raises on his right lower extremity for improved strength and endurance needed for functional activities such as running.  Baseline: 9/24: 18 reps Goal status: IN PROGRESS  6.  Patient will be able to run with no significant gait deviations.  Baseline:  Goal status: IN PROGRESS   PLAN:  PT FREQUENCY: 2x/week  PT DURATION: 6 weeks  PLANNED INTERVENTIONS: Therapeutic exercises, Therapeutic activity, Neuromuscular re-education, Balance training, Gait training, Patient/Family education, Self Care, Joint mobilization, Stair training, Electrical stimulation, Cryotherapy, Moist heat, Vasopneumatic device, Manual therapy, and Re-evaluation  PLAN FOR NEXT SESSION:  Nustep, review and update HEP as needed, right ankle AROM and PROM, gait training (wean out of CAM boot, as able), and modalities as needed   Newman Pies, PTA 03/20/2023, 12:26 PM

## 2023-03-22 ENCOUNTER — Ambulatory Visit: Payer: 59

## 2023-03-22 DIAGNOSIS — M25671 Stiffness of right ankle, not elsewhere classified: Secondary | ICD-10-CM

## 2023-03-22 DIAGNOSIS — M25571 Pain in right ankle and joints of right foot: Secondary | ICD-10-CM

## 2023-03-22 NOTE — Therapy (Signed)
OUTPATIENT PHYSICAL THERAPY LOWER EXTREMITY TREATMENT   Patient Name: Dean Cross MRN: [id number] DOB:03-Oct-1980, 42 y.o., male Today's Date: 03/22/2023  END OF SESSION:  PT End of Session - 03/22/23 1642     Visit Number 7    Number of Visits 12    Date for PT Re-Evaluation 05/25/23    PT Start Time 1640    PT Stop Time 1735    PT Time Calculation (min) 55 min    Activity Tolerance Patient tolerated treatment well    Behavior During Therapy WFL for tasks assessed/performed                Past Medical History:  Diagnosis Date   Allergy    celiac's and seasonal   Celiac disease    GERD (gastroesophageal reflux disease)    Past Surgical History:  Procedure Laterality Date   APPENDECTOMY     1992   FINGER AMPUTATION  2000   HERNIA REPAIR  2011   b/l inguinal hernia    ORIF ANKLE FRACTURE Right 01/16/2023   Procedure: OPEN REDUCTION INTERNAL FIXATION (ORIF) ANKLE FRACTURE;  Surgeon: Sheral Apley, MD;  Location: [facility];  Service: Orthopedics;  Laterality: Right;   VASECTOMY  2018   Patient Active Problem List   Diagnosis Date Noted   Closed displaced trimalleolar fracture of right ankle 01/16/2023   Dyslipidemia 11/12/2019   GERD (gastroesophageal reflux disease) 08/06/2018   Seasonal allergies 08/06/2018   Celiac disease 12/09/2015   Family history of polycystic kidney disease 08/03/2015    PCP: Ardith Dark, MD  REFERRING PROVIDER: Sheral Apley, MD  REFERRING DIAG: s/p right ankle trimalleolar fracture ORIF 01/16/23  THERAPY DIAG:  Stiffness of right ankle, not elsewhere classified  Pain in right ankle and joints of right foot  Rationale for Evaluation and Treatment: Rehabilitation  ONSET DATE: 01/16/23  SUBJECTIVE:   SUBJECTIVE STATEMENT: Patient reports that his ankle was sore and a little swollen after his last appointment.   PERTINENT HISTORY: Allergies PAIN:  Are you having pain? Yes: NPRS scale:  2/10 Pain location: right ankle  Pain description: "twinge"  Aggravating factors: certain ankle movements Relieving factors: medication   PRECAUTIONS: None  RED FLAGS: None   WEIGHT BEARING RESTRICTIONS: No  FALLS:  Has patient fallen in last 6 months? No  LIVING ENVIRONMENT: Lives with: lives with their family Lives in: House/apartment Stairs: Yes: Internal: 15 steps; can reach both and External: 3 steps; none; reciprocal pattern ascending and step to pattern descending Has following equipment at home:  CAM boot  OCCUPATION: physician  PLOF: Independent  PATIENT GOALS: return to soccer (twice per week for 1 hour), running (2-3 miles prior to injury), and lifting weights  NEXT MD VISIT: 04/04/23  OBJECTIVE:   PATIENT SURVEYS:  FOTO 45.26  COGNITION: Overall cognitive status: Within functional limits for tasks assessed     SENSATION: Patient reports some numbness along the dorsal surface of his right foot.   EDEMA:  Circumferential: R: 55 cm L: 54.5 cm  PALPATION: TTP: right achilles tendon and fibularis longus  JOINT MOBILITY:  Right talocrural: hypomobile and nonpainful   Right midfoot: hypomobile and nonpainful   Right subtalar: hypomobile and nonpainful   LOWER EXTREMITY ROM:  Active ROM Right eval Left eval  Hip flexion    Hip extension    Hip abduction    Hip adduction    Hip internal rotation    Hip external rotation    Knee  flexion    Knee extension    Ankle dorsiflexion -20 0  Ankle plantarflexion 35 55  Ankle inversion 25 30  Ankle eversion 5 27   (Blank rows = not tested)  LOWER EXTREMITY MMT:  MMT Right eval Left eval  Hip flexion    Hip extension    Hip abduction    Hip adduction    Hip internal rotation    Hip external rotation    Knee flexion    Knee extension    Ankle dorsiflexion 4/5; "very tight"  5/5  Ankle plantarflexion    Ankle inversion 3+/5; limited by pain  5/5  Ankle eversion 3+/5 5/5   (Blank rows = not  tested)  FUNCTIONAL TESTS:  L single leg heel raise: 49 reps in 45.38 seconds  BALANCE:  R SLS on firm surface: 6 seconds  L SLS on firm surface: 30 seconds  GAIT: Assistive device utilized: None Level of assistance: Complete Independence Comments: poor weight shift onto right lower extremity and toe off on the right foot due to limited right ankle dorsiflexion   TODAY'S TREATMENT:                                                                                                                              DATE:                                     03/22/23 EXERCISE LOG  Exercise Repetitions and Resistance Comments  Elliptical  L3/R3 x 5 minutes forward and 5 minutes backward    SLS on foam  4 x 30 second each   BOSU squat (ball down)  2 minutes   Resisted inversion/eversion  Green t-band x 2.5 minutes    Resisted DF Green t-band x 2 minutes   Marching on BOSU (ball up)  3 minutes  Intermittent UE support   SL sit to stand 15 reps each  From elevated table; alternating LE  Standing gastroc stretch  3 minutes     Blank cell = exercise not performed today  Modalities: no adverse reaction to today's modalities  Date:  Vaso: Ankle, 34 degrees; low pressure, 10 mins, Pain and Edema                                   03/20/23 EXERCISE LOG  Exercise Repetitions and Resistance Comments  Recumbent bike  L5 x 9 minutes    Elliptical  L3/L3 4 mins forward/4 mins backward   Gastroc stretch with 2" step 4 reps x 30 secs   Soleus stretch     Squats BOSU ball down x 20 reps Intermittent UE support   Rocker board (lateral)  3 minutes   SLS with 5 cones 2 reps   Plantar/dorsiflexion    Inversion/eversion  ABC    Marble pickup     Blank cell = exercise not performed today  Modalities  Date:  Vaso: Ankle, 34 degrees; low pressure, 10 mins, Edema                                   03/08/23 EXERCISE LOG  Exercise Repetitions and Resistance Comments  Rocker board  6 minutes   Lunges  onto step  6" step x 2.5 minutes   R foot on step   BAPS circles  L3 x 30 reps each   CW and CCW  Anterior tibialis stretch  2.5 minutes   Tandem on foam  2 x 1 minutes  Rare UE support  Recumbent bike  L5 x 14 minutes   ABC's  1 rep     Blank cell = exercise not performed today     PATIENT EDUCATION:  Education details: POC, healing, expectations for soreness and edema, prognosis, and goals for therapy Person educated: Patient Education method: Explanation Education comprehension: verbalized understanding  HOME EXERCISE PROGRAM: [id number]  ASSESSMENT:  CLINICAL IMPRESSION: Patient was progressed with multiple new and familiar interventions for improved ankle strength and stability with moderate difficulty. He required minimal cueing with today's new interventions for proper exercise performance. He experienced a mild increase in soreness with today's interventions, but this did not inhibit his ability to complete any of today's interventions. He reported that his ankle felt good upon the conclusion of treatment. He continues to require skilled physical therapy to address his remaining impairments to return to his prior level of function.   OBJECTIVE IMPAIRMENTS: Abnormal gait, decreased activity tolerance, decreased balance, decreased endurance, decreased mobility, difficulty walking, decreased ROM, decreased strength, hypomobility, increased edema, impaired sensation, and pain.   ACTIVITY LIMITATIONS: carrying, lifting, standing, squatting, stairs, dressing, and locomotion level  PARTICIPATION LIMITATIONS: meal prep, cleaning, laundry, shopping, and community activity  PERSONAL FACTORS: Time since onset of injury/illness/exacerbation and 1 comorbidity: allergies  are also affecting patient's functional outcome.   REHAB POTENTIAL: Good  CLINICAL DECISION MAKING: Evolving/moderate complexity  EVALUATION COMPLEXITY: Moderate   GOALS: Goals reviewed with patient? Yes  SHORT TERM  GOALS: Target date: 03/22/23 Patient will be independent with his initial HEP.  Baseline: Goal status: MET  2.  Patient will be able to demonstrate right ankle dorsiflexion to within 10 degrees of neutral for improved gait mechanics.  Baseline: 5 degrees of dorsiflexion Goal status: MET  3.  Patient will improve his single leg stance time on his right lower extremity to at least 12 seconds for improved right ankle stability.  Baseline: 16.67 seconds Goal status: MET  4.  Patient will improve his right ankle eversion to at least 18 degrees for improved function walking on uneven terrain.  Baseline: 24 degress Goal status: MET  LONG TERM GOALS: Target date: 04/12/23  Patient will be independent with his advanced HEP.  Baseline:  Goal status: IN PROGRESS  2.  Patient will improve his right ankle dorsiflexion to at least neutral for improved ankle mobility.  Baseline:  Goal status: MET  3.  Patient will be able to ambulate with no significant gait deviations.  Baseline:  Goal status: PARTIALLY MET  4.  Patient will be able to navigate at least 4 steps with a reciprocal pattern for improved household mobility.  Baseline:  Goal status: IN PROGRESS  5.  Patient will be able to demonstrate at least  30 repetitions of single leg heel raises on his right lower extremity for improved strength and endurance needed for functional activities such as running.  Baseline: 9/24: 18 reps Goal status: IN PROGRESS  6.  Patient will be able to run with no significant gait deviations.  Baseline:  Goal status: IN PROGRESS   PLAN:  PT FREQUENCY: 2x/week  PT DURATION: 6 weeks  PLANNED INTERVENTIONS: Therapeutic exercises, Therapeutic activity, Neuromuscular re-education, Balance training, Gait training, Patient/Family education, Self Care, Joint mobilization, Stair training, Electrical stimulation, Cryotherapy, Moist heat, Vasopneumatic device, Manual therapy, and Re-evaluation  PLAN FOR NEXT  SESSION: Nustep, review and update HEP as needed, right ankle AROM and PROM, gait training (wean out of CAM boot, as able), and modalities as needed   Granville Lewis, PT 03/22/2023, 5:46 PM

## 2023-03-27 ENCOUNTER — Ambulatory Visit: Payer: 59 | Attending: Orthopedic Surgery

## 2023-03-27 DIAGNOSIS — M25571 Pain in right ankle and joints of right foot: Secondary | ICD-10-CM | POA: Insufficient documentation

## 2023-03-27 DIAGNOSIS — M25671 Stiffness of right ankle, not elsewhere classified: Secondary | ICD-10-CM | POA: Insufficient documentation

## 2023-03-27 NOTE — Therapy (Signed)
OUTPATIENT PHYSICAL THERAPY LOWER EXTREMITY TREATMENT   Patient Name: Dean Cross MRN: [id number] DOB:06-17-1981, 42 y.o., male Today's Date: 03/27/2023  END OF SESSION:  PT End of Session - 03/27/23 1209     Visit Number 8    Number of Visits 12    Date for PT Re-Evaluation 05/25/23    PT Start Time 1100    PT Stop Time 1145    PT Time Calculation (min) 45 min    Activity Tolerance Patient tolerated treatment well    Behavior During Therapy WFL for tasks assessed/performed                 Past Medical History:  Diagnosis Date   Allergy    celiac's and seasonal   Celiac disease    GERD (gastroesophageal reflux disease)    Past Surgical History:  Procedure Laterality Date   APPENDECTOMY     1992   FINGER AMPUTATION  2000   HERNIA REPAIR  2011   b/l inguinal hernia    ORIF ANKLE FRACTURE Right 01/16/2023   Procedure: OPEN REDUCTION INTERNAL FIXATION (ORIF) ANKLE FRACTURE;  Surgeon: Sheral Apley, MD;  Location: [facility];  Service: Orthopedics;  Laterality: Right;   VASECTOMY  2018   Patient Active Problem List   Diagnosis Date Noted   Closed displaced trimalleolar fracture of right ankle 01/16/2023   Dyslipidemia 11/12/2019   GERD (gastroesophageal reflux disease) 08/06/2018   Seasonal allergies 08/06/2018   Celiac disease 12/09/2015   Family history of polycystic kidney disease 08/03/2015    PCP: Ardith Dark, MD  REFERRING PROVIDER: Sheral Apley, MD  REFERRING DIAG: s/p right ankle trimalleolar fracture ORIF 01/16/23  THERAPY DIAG:  Stiffness of right ankle, not elsewhere classified  Pain in right ankle and joints of right foot  Rationale for Evaluation and Treatment: Rehabilitation  ONSET DATE: 01/16/23  SUBJECTIVE:   SUBJECTIVE STATEMENT: Patient reports that he is sore today. He notes that this is due to him doing a light jog at soccer practice yesterday.   PERTINENT HISTORY: Allergies PAIN:  Are you  having pain? Yes: NPRS scale: 4-5/10 Pain location: right ankle  Pain description: "twinge"  Aggravating factors: certain ankle movements Relieving factors: medication   PRECAUTIONS: None  RED FLAGS: None   WEIGHT BEARING RESTRICTIONS: No  FALLS:  Has patient fallen in last 6 months? No  LIVING ENVIRONMENT: Lives with: lives with their family Lives in: House/apartment Stairs: Yes: Internal: 15 steps; can reach both and External: 3 steps; none; reciprocal pattern ascending and step to pattern descending Has following equipment at home:  CAM boot  OCCUPATION: physician  PLOF: Independent  PATIENT GOALS: return to soccer (twice per week for 1 hour), running (2-3 miles prior to injury), and lifting weights  NEXT MD VISIT: 04/04/23  OBJECTIVE:   PATIENT SURVEYS:  FOTO 45.26  COGNITION: Overall cognitive status: Within functional limits for tasks assessed     SENSATION: Patient reports some numbness along the dorsal surface of his right foot.   EDEMA:  Circumferential: R: 55 cm L: 54.5 cm  PALPATION: TTP: right achilles tendon and fibularis longus  JOINT MOBILITY:  Right talocrural: hypomobile and nonpainful   Right midfoot: hypomobile and nonpainful   Right subtalar: hypomobile and nonpainful   LOWER EXTREMITY ROM:  Active ROM Right eval Left eval  Hip flexion    Hip extension    Hip abduction    Hip adduction    Hip internal rotation  Hip external rotation    Knee flexion    Knee extension    Ankle dorsiflexion -20 0  Ankle plantarflexion 35 55  Ankle inversion 25 30  Ankle eversion 5 27   (Blank rows = not tested)  LOWER EXTREMITY MMT:  MMT Right eval Left eval  Hip flexion    Hip extension    Hip abduction    Hip adduction    Hip internal rotation    Hip external rotation    Knee flexion    Knee extension    Ankle dorsiflexion 4/5; "very tight"  5/5  Ankle plantarflexion    Ankle inversion 3+/5; limited by pain  5/5  Ankle eversion  3+/5 5/5   (Blank rows = not tested)  FUNCTIONAL TESTS:  L single leg heel raise: 49 reps in 45.38 seconds  BALANCE:  R SLS on firm surface: 6 seconds  L SLS on firm surface: 30 seconds  GAIT: Assistive device utilized: None Level of assistance: Complete Independence Comments: poor weight shift onto right lower extremity and toe off on the right foot due to limited right ankle dorsiflexion   TODAY'S TREATMENT:                                                                                                                              DATE:                                     03/27/23 EXERCISE LOG  Exercise Repetitions and Resistance Comments  Elliptical  L3/R3 x 6 minutes forward and backward   Cybex leg press 3 plates; seat 5 x 2.5 minutes  With ankle DF  Kicking  15 reps each  In SLS   Cone taps on airex 2 minutes each    Rocker board  4.5 minutes BUE support   Side stepping on foam Green t-band @ feet x 3 laps each    Marching  Green t-band @ feet x 3 minutes  For ankle dorsiflexion   SL sit to stand  20 reps each  Required minimal UE assistance from arm rests for RLE   Blank cell = exercise not performed today                                    03/22/23 EXERCISE LOG  Exercise Repetitions and Resistance Comments  Elliptical  L3/R3 x 5 minutes forward and 5 minutes backward    SLS on foam  4 x 30 second each   BOSU squat (ball down)  2 minutes   Resisted inversion/eversion  Green t-band x 2.5 minutes    Resisted DF Green t-band x 2 minutes   Marching on BOSU (ball up)  3 minutes  Intermittent UE support   SL sit to stand 15 reps  each  From elevated table; alternating LE  Standing gastroc stretch  3 minutes     Blank cell = exercise not performed today  Modalities: no adverse reaction to today's modalities  Date:  Vaso: Ankle, 34 degrees; low pressure, 10 mins, Pain and Edema                                   03/20/23 EXERCISE LOG  Exercise Repetitions and Resistance  Comments  Recumbent bike  L5 x 9 minutes    Elliptical  L3/L3 4 mins forward/4 mins backward   Gastroc stretch with 2" step 4 reps x 30 secs   Soleus stretch     Squats BOSU ball down x 20 reps Intermittent UE support   Rocker board (lateral)  3 minutes   SLS with 5 cones 2 reps   Plantar/dorsiflexion    Inversion/eversion    ABC    Marble pickup     Blank cell = exercise not performed today  Modalities  Date:  Vaso: Ankle, 34 degrees; low pressure, 10 mins, Edema  PATIENT EDUCATION:  Education details: POC, healing, expectations for soreness and edema, prognosis, and goals for therapy Person educated: Patient Education method: Explanation Education comprehension: verbalized understanding  HOME EXERCISE PROGRAM: [id number]  ASSESSMENT:  CLINICAL IMPRESSION: Patient was progressed with multiple new interventions for improved lower extremity strength and stability needed for improved function with his daily activities. He required minimal cueing with the weighted leg press for improved eccentric control. He experienced the most difficulty with single leg sit to stands as he required minimal upper extremity support when utilizing the right lower extremity. He reported feeling good upon the conclusion of treatment. He continues to require skilled physical therapy to address his remaining impairments to return to his prior level of function.   OBJECTIVE IMPAIRMENTS: Abnormal gait, decreased activity tolerance, decreased balance, decreased endurance, decreased mobility, difficulty walking, decreased ROM, decreased strength, hypomobility, increased edema, impaired sensation, and pain.   ACTIVITY LIMITATIONS: carrying, lifting, standing, squatting, stairs, dressing, and locomotion level  PARTICIPATION LIMITATIONS: meal prep, cleaning, laundry, shopping, and community activity  PERSONAL FACTORS: Time since onset of injury/illness/exacerbation and 1 comorbidity: allergies  are also  affecting patient's functional outcome.   REHAB POTENTIAL: Good  CLINICAL DECISION MAKING: Evolving/moderate complexity  EVALUATION COMPLEXITY: Moderate   GOALS: Goals reviewed with patient? Yes  SHORT TERM GOALS: Target date: 03/22/23 Patient will be independent with his initial HEP.  Baseline: Goal status: MET  2.  Patient will be able to demonstrate right ankle dorsiflexion to within 10 degrees of neutral for improved gait mechanics.  Baseline: 5 degrees of dorsiflexion Goal status: MET  3.  Patient will improve his single leg stance time on his right lower extremity to at least 12 seconds for improved right ankle stability.  Baseline: 16.67 seconds Goal status: MET  4.  Patient will improve his right ankle eversion to at least 18 degrees for improved function walking on uneven terrain.  Baseline: 24 degress Goal status: MET  LONG TERM GOALS: Target date: 04/12/23  Patient will be independent with his advanced HEP.  Baseline:  Goal status: IN PROGRESS  2.  Patient will improve his right ankle dorsiflexion to at least neutral for improved ankle mobility.  Baseline:  Goal status: MET  3.  Patient will be able to ambulate with no significant gait deviations.  Baseline:  Goal status: PARTIALLY MET  4.  Patient will be able to navigate at least 4 steps with a reciprocal pattern for improved household mobility.  Baseline:  Goal status: IN PROGRESS  5.  Patient will be able to demonstrate at least 30 repetitions of single leg heel raises on his right lower extremity for improved strength and endurance needed for functional activities such as running.  Baseline: 9/24: 18 reps Goal status: IN PROGRESS  6.  Patient will be able to run with no significant gait deviations.  Baseline:  Goal status: IN PROGRESS   PLAN:  PT FREQUENCY: 2x/week  PT DURATION: 6 weeks  PLANNED INTERVENTIONS: Therapeutic exercises, Therapeutic activity, Neuromuscular re-education, Balance  training, Gait training, Patient/Family education, Self Care, Joint mobilization, Stair training, Electrical stimulation, Cryotherapy, Moist heat, Vasopneumatic device, Manual therapy, and Re-evaluation  PLAN FOR NEXT SESSION: Nustep, review and update HEP as needed, right ankle AROM and PROM, gait training (wean out of CAM boot, as able), and modalities as needed   Granville Lewis, PT 03/27/2023, 12:16 PM

## 2023-03-29 ENCOUNTER — Ambulatory Visit: Payer: 59

## 2023-03-29 DIAGNOSIS — M25571 Pain in right ankle and joints of right foot: Secondary | ICD-10-CM

## 2023-03-29 DIAGNOSIS — M25671 Stiffness of right ankle, not elsewhere classified: Secondary | ICD-10-CM | POA: Diagnosis not present

## 2023-03-29 NOTE — Therapy (Signed)
OUTPATIENT PHYSICAL THERAPY LOWER EXTREMITY TREATMENT   Patient Name: Dean Cross MRN: [id number] DOB:April 07, 1981, 42 y.o., male Today's Date: 03/29/2023  END OF SESSION:  PT End of Session - 03/29/23 1639     Visit Number 9    Number of Visits 12    Date for PT Re-Evaluation 05/25/23    PT Start Time 1645    PT Stop Time 1726    PT Time Calculation (min) 41 min    Activity Tolerance Patient tolerated treatment well    Behavior During Therapy WFL for tasks assessed/performed                  Past Medical History:  Diagnosis Date   Allergy    celiac's and seasonal   Celiac disease    GERD (gastroesophageal reflux disease)    Past Surgical History:  Procedure Laterality Date   APPENDECTOMY     1992   FINGER AMPUTATION  2000   HERNIA REPAIR  2011   b/l inguinal hernia    ORIF ANKLE FRACTURE Right 01/16/2023   Procedure: OPEN REDUCTION INTERNAL FIXATION (ORIF) ANKLE FRACTURE;  Surgeon: Sheral Apley, MD;  Location: [facility];  Service: Orthopedics;  Laterality: Right;   VASECTOMY  2018   Patient Active Problem List   Diagnosis Date Noted   Closed displaced trimalleolar fracture of right ankle 01/16/2023   Dyslipidemia 11/12/2019   GERD (gastroesophageal reflux disease) 08/06/2018   Seasonal allergies 08/06/2018   Celiac disease 12/09/2015   Family history of polycystic kidney disease 08/03/2015    PCP: Ardith Dark, MD  REFERRING PROVIDER: Sheral Apley, MD  REFERRING DIAG: s/p right ankle trimalleolar fracture ORIF 01/16/23  THERAPY DIAG:  Stiffness of right ankle, not elsewhere classified  Pain in right ankle and joints of right foot  Rationale for Evaluation and Treatment: Rehabilitation  ONSET DATE: 01/16/23  SUBJECTIVE:   SUBJECTIVE STATEMENT: Patient reports that his whole leg is sore today. He notes that this is due to him jogging about 1 mile (about 15 minute mile). However, his calf feels good.   PERTINENT  HISTORY: Allergies PAIN:  Are you having pain? Yes: NPRS scale: 3-4/10 Pain location: right ankle  Pain description: "twinge"  Aggravating factors: certain ankle movements Relieving factors: medication   PRECAUTIONS: None  RED FLAGS: None   WEIGHT BEARING RESTRICTIONS: No  FALLS:  Has patient fallen in last 6 months? No  LIVING ENVIRONMENT: Lives with: lives with their family Lives in: House/apartment Stairs: Yes: Internal: 15 steps; can reach both and External: 3 steps; none; reciprocal pattern ascending and step to pattern descending Has following equipment at home:  CAM boot  OCCUPATION: physician  PLOF: Independent  PATIENT GOALS: return to soccer (twice per week for 1 hour), running (2-3 miles prior to injury), and lifting weights  NEXT MD VISIT: 04/04/23  OBJECTIVE:   PATIENT SURVEYS:  FOTO 69.90 on 03/29/23  COGNITION: Overall cognitive status: Within functional limits for tasks assessed     SENSATION: Patient reports some numbness along the dorsal surface of his right foot.   EDEMA:  Circumferential: R: 55 cm L: 54.5 cm  PALPATION: TTP: right achilles tendon and fibularis longus  JOINT MOBILITY:  Right talocrural: hypomobile and nonpainful   Right midfoot: hypomobile and nonpainful   Right subtalar: hypomobile and nonpainful   LOWER EXTREMITY ROM:  Active ROM Right eval Left eval  Hip flexion    Hip extension    Hip abduction  Hip adduction    Hip internal rotation    Hip external rotation    Knee flexion    Knee extension    Ankle dorsiflexion -20 0  Ankle plantarflexion 35 55  Ankle inversion 25 30  Ankle eversion 5 27   (Blank rows = not tested)  LOWER EXTREMITY MMT:  MMT Right eval Left eval  Hip flexion    Hip extension    Hip abduction    Hip adduction    Hip internal rotation    Hip external rotation    Knee flexion    Knee extension    Ankle dorsiflexion 4/5; "very tight"  5/5  Ankle plantarflexion    Ankle  inversion 3+/5; limited by pain  5/5  Ankle eversion 3+/5 5/5   (Blank rows = not tested)  FUNCTIONAL TESTS:  L single leg heel raise: 49 reps in 45.38 seconds  BALANCE:  R SLS on firm surface: 6 seconds  L SLS on firm surface: 30 seconds  GAIT: Assistive device utilized: None Level of assistance: Complete Independence Comments: poor weight shift onto right lower extremity and toe off on the right foot due to limited right ankle dorsiflexion   TODAY'S TREATMENT:                                                                                                                              DATE:                                     03/29/23 EXERCISE LOG  Exercise Repetitions and Resistance Comments  Elliptical  L3/R3 x 6 minutes forward and backward   Ball toss  3 minutes On rocker board; intermittent UE support   Rocker board  3 minutes  Intermittent UE support   Toe raise  2 minutes    SLS on foam 3 x 30 seconds each    Standing gastroc stretch  2 minutes   Standing soleus stretch  3 minutes   Ankle circles 3 minutes   CW and CCW    Blank cell = exercise not performed today                                    03/27/23 EXERCISE LOG  Exercise Repetitions and Resistance Comments  Elliptical  L3/R3 x 6 minutes forward and backward   Cybex leg press 3 plates; seat 5 x 2.5 minutes  With ankle DF  Kicking  15 reps each  In SLS   Cone taps on airex 2 minutes each    Rocker board  4.5 minutes BUE support   Side stepping on foam Green t-band @ feet x 3 laps each    Marching  Green t-band @ feet x 3 minutes  For ankle dorsiflexion   SL  sit to stand  20 reps each  Required minimal UE assistance from arm rests for RLE   Blank cell = exercise not performed today                                    03/22/23 EXERCISE LOG  Exercise Repetitions and Resistance Comments  Elliptical  L3/R3 x 5 minutes forward and 5 minutes backward    SLS on foam  4 x 30 second each   BOSU squat (ball down)  2  minutes   Resisted inversion/eversion  Green t-band x 2.5 minutes    Resisted DF Green t-band x 2 minutes   Marching on BOSU (ball up)  3 minutes  Intermittent UE support   SL sit to stand 15 reps each  From elevated table; alternating LE  Standing gastroc stretch  3 minutes     Blank cell = exercise not performed today  Modalities: no adverse reaction to today's modalities  Date:  Vaso: Ankle, 34 degrees; low pressure, 10 mins, Pain and Edema  PATIENT EDUCATION:  Education details: POC, healing, expectations for soreness and edema, prognosis, and goals for therapy Person educated: Patient Education method: Explanation Education comprehension: verbalized understanding  HOME EXERCISE PROGRAM: [id number]  ASSESSMENT:  CLINICAL IMPRESSION: Treatment focused on new and familiar interventions for improved right ankle stability. He experienced moderate difficulty with these interventions which could partially be attributed to his increased soreness after jogging on Tuesday (03/27/23). He experienced no significant increase in pain or discomfort with any of today's interventions. He reported that his ankle felt tired and sore upon the conclusion of treatment. He continues to require skilled physical therapy to address his remaining impairments to return to his prior level of function.   OBJECTIVE IMPAIRMENTS: Abnormal gait, decreased activity tolerance, decreased balance, decreased endurance, decreased mobility, difficulty walking, decreased ROM, decreased strength, hypomobility, increased edema, impaired sensation, and pain.   ACTIVITY LIMITATIONS: carrying, lifting, standing, squatting, stairs, dressing, and locomotion level  PARTICIPATION LIMITATIONS: meal prep, cleaning, laundry, shopping, and community activity  PERSONAL FACTORS: Time since onset of injury/illness/exacerbation and 1 comorbidity: allergies  are also affecting patient's functional outcome.   REHAB POTENTIAL:  Good  CLINICAL DECISION MAKING: Evolving/moderate complexity  EVALUATION COMPLEXITY: Moderate   GOALS: Goals reviewed with patient? Yes  SHORT TERM GOALS: Target date: 03/22/23 Patient will be independent with his initial HEP.  Baseline: Goal status: MET  2.  Patient will be able to demonstrate right ankle dorsiflexion to within 10 degrees of neutral for improved gait mechanics.  Baseline: 5 degrees of dorsiflexion Goal status: MET  3.  Patient will improve his single leg stance time on his right lower extremity to at least 12 seconds for improved right ankle stability.  Baseline: 16.67 seconds Goal status: MET  4.  Patient will improve his right ankle eversion to at least 18 degrees for improved function walking on uneven terrain.  Baseline: 24 degress Goal status: MET  LONG TERM GOALS: Target date: 04/12/23  Patient will be independent with his advanced HEP.  Baseline:  Goal status: IN PROGRESS  2.  Patient will improve his right ankle dorsiflexion to at least neutral for improved ankle mobility.  Baseline:  Goal status: MET  3.  Patient will be able to ambulate with no significant gait deviations.  Baseline:  Goal status: PARTIALLY MET  4.  Patient will be able to navigate at least 4  steps with a reciprocal pattern for improved household mobility.  Baseline:  Goal status: IN PROGRESS  5.  Patient will be able to demonstrate at least 30 repetitions of single leg heel raises on his right lower extremity for improved strength and endurance needed for functional activities such as running.  Baseline: 9/24: 18 reps Goal status: IN PROGRESS  6.  Patient will be able to run with no significant gait deviations.  Baseline:  Goal status: IN PROGRESS   PLAN:  PT FREQUENCY: 2x/week  PT DURATION: 6 weeks  PLANNED INTERVENTIONS: Therapeutic exercises, Therapeutic activity, Neuromuscular re-education, Balance training, Gait training, Patient/Family education, Self Care,  Joint mobilization, Stair training, Electrical stimulation, Cryotherapy, Moist heat, Vasopneumatic device, Manual therapy, and Re-evaluation  PLAN FOR NEXT SESSION: Nustep, review and update HEP as needed, right ankle AROM and PROM, gait training (wean out of CAM boot, as able), and modalities as needed   Granville Lewis, PT 03/29/2023, 5:32 PM

## 2023-04-03 ENCOUNTER — Ambulatory Visit: Payer: 59

## 2023-04-03 DIAGNOSIS — M25571 Pain in right ankle and joints of right foot: Secondary | ICD-10-CM

## 2023-04-03 DIAGNOSIS — M25671 Stiffness of right ankle, not elsewhere classified: Secondary | ICD-10-CM

## 2023-04-03 NOTE — Therapy (Signed)
OUTPATIENT PHYSICAL THERAPY LOWER EXTREMITY TREATMENT   Patient Name: Dean Cross MRN: [id number] DOB:03/02/1981, 42 y.o., male Today's Date: 04/03/2023  END OF SESSION:  PT End of Session - 04/03/23 1111     Visit Number 10    Number of Visits 12    Date for PT Re-Evaluation 05/25/23    PT Start Time 1100    PT Stop Time 1150    PT Time Calculation (min) 50 min    Activity Tolerance Patient tolerated treatment well    Behavior During Therapy WFL for tasks assessed/performed                   Past Medical History:  Diagnosis Date   Allergy    celiac's and seasonal   Celiac disease    GERD (gastroesophageal reflux disease)    Past Surgical History:  Procedure Laterality Date   APPENDECTOMY     1992   FINGER AMPUTATION  2000   HERNIA REPAIR  2011   b/l inguinal hernia    ORIF ANKLE FRACTURE Right 01/16/2023   Procedure: OPEN REDUCTION INTERNAL FIXATION (ORIF) ANKLE FRACTURE;  Surgeon: Sheral Apley, MD;  Location: [facility];  Service: Orthopedics;  Laterality: Right;   VASECTOMY  2018   Patient Active Problem List   Diagnosis Date Noted   Closed displaced trimalleolar fracture of right ankle 01/16/2023   Dyslipidemia 11/12/2019   GERD (gastroesophageal reflux disease) 08/06/2018   Seasonal allergies 08/06/2018   Celiac disease 12/09/2015   Family history of polycystic kidney disease 08/03/2015    PCP: Ardith Dark, MD  REFERRING PROVIDER: Sheral Apley, MD  REFERRING DIAG: s/p right ankle trimalleolar fracture ORIF 01/16/23  THERAPY DIAG:  Stiffness of right ankle, not elsewhere classified  Pain in right ankle and joints of right foot  Rationale for Evaluation and Treatment: Rehabilitation  ONSET DATE: 01/16/23  SUBJECTIVE:   SUBJECTIVE STATEMENT: Patient reports that he still gets sore after standing and walking for long times like when he is at soccer practice. However, he only has "minimal" soreness today. He  notes that he still has some lateral ankle instability. He feels that he is about 70% back to his prior level of function.   PERTINENT HISTORY: Allergies PAIN:  Are you having pain? Yes: NPRS scale: "minimal"/10 Pain location: right ankle  Pain description: "twinge"  Aggravating factors: certain ankle movements Relieving factors: medication   PRECAUTIONS: None  RED FLAGS: None   WEIGHT BEARING RESTRICTIONS: No  FALLS:  Has patient fallen in last 6 months? No  LIVING ENVIRONMENT: Lives with: lives with their family Lives in: House/apartment Stairs: Yes: Internal: 15 steps; can reach both and External: 3 steps; none; reciprocal pattern ascending and step to pattern descending Has following equipment at home:  CAM boot  OCCUPATION: physician  PLOF: Independent  PATIENT GOALS: return to soccer (twice per week for 1 hour), running (2-3 miles prior to injury), and lifting weights  NEXT MD VISIT: 04/04/23  OBJECTIVE:   PATIENT SURVEYS:  FOTO 69.90 on 03/29/23  COGNITION: Overall cognitive status: Within functional limits for tasks assessed     SENSATION: Patient reports some numbness along the dorsal surface of his right foot.   EDEMA:  Circumferential: R: 55 cm L: 54.5 cm  PALPATION: TTP: right achilles tendon and fibularis longus  JOINT MOBILITY:  Right talocrural: hypomobile and nonpainful   Right midfoot: hypomobile and nonpainful   Right subtalar: hypomobile and nonpainful   LOWER EXTREMITY  ROM:  Active ROM Right eval Left eval Right 04/03/23  Hip flexion     Hip extension     Hip abduction     Hip adduction     Hip internal rotation     Hip external rotation     Knee flexion     Knee extension     Ankle dorsiflexion -20 0 -3  Ankle plantarflexion 35 55 40  Ankle inversion 25 30 38  Ankle eversion 5 27 10    (Blank rows = not tested)  LOWER EXTREMITY MMT:  MMT Right eval Left eval  Hip flexion    Hip extension    Hip abduction    Hip  adduction    Hip internal rotation    Hip external rotation    Knee flexion    Knee extension    Ankle dorsiflexion 4/5; "very tight"  5/5  Ankle plantarflexion    Ankle inversion 3+/5; limited by pain  5/5  Ankle eversion 3+/5 5/5   (Blank rows = not tested)  FUNCTIONAL TESTS:  L single leg heel raise: 49 reps in 45.38 seconds  BALANCE:  R SLS on firm surface: 6 seconds  L SLS on firm surface: 30 seconds  GAIT: Assistive device utilized: None Level of assistance: Complete Independence Comments: poor weight shift onto right lower extremity and toe off on the right foot due to limited right ankle dorsiflexion   TODAY'S TREATMENT:                                                                                                                              DATE:                                      EXERCISE LOG  Exercise Repetitions and Resistance Comments  Elliptical  L3/R3 x 7 minutes forward and backward    Standing gastroc stretch  4 x 30 seconds  RLE only   Marching on BOSU (ball up)   3 minutes  Without UE support   Lateral step up with push off (BOSU ball up) 3 minutes  Intermittent UE support   Cybex leg press  2 plates; seat 6 x 3.5 minutes  2 x 1 minute with RLE and 1.5 minutes with BLE       Blank cell = exercise not performed today                                    03/29/23 EXERCISE LOG  Exercise Repetitions and Resistance Comments  Elliptical  L3/R3 x 6 minutes forward and backward   Ball toss  3 minutes On rocker board; intermittent UE support   Rocker board  3 minutes  Intermittent UE support   Toe raise  2 minutes    SLS  on foam 3 x 30 seconds each    Standing gastroc stretch  2 minutes   Standing soleus stretch  3 minutes   Ankle circles 3 minutes   CW and CCW    Blank cell = exercise not performed today                                    03/27/23 EXERCISE LOG  Exercise Repetitions and Resistance Comments  Elliptical  L3/R3 x 6 minutes forward and  backward   Cybex leg press 3 plates; seat 5 x 2.5 minutes  With ankle DF  Kicking  15 reps each  In SLS   Cone taps on airex 2 minutes each    Rocker board  4.5 minutes BUE support   Side stepping on foam Green t-band @ feet x 3 laps each    Marching  Green t-band @ feet x 3 minutes  For ankle dorsiflexion   SL sit to stand  20 reps each  Required minimal UE assistance from arm rests for RLE   Blank cell = exercise not performed today   PATIENT EDUCATION:  Education details: healing, progress with therapy, and objective findings Person educated: Patient Education method: Explanation Education comprehension: verbalized understanding  HOME EXERCISE PROGRAM: [id number]  ASSESSMENT:  CLINICAL IMPRESSION: Patient is making good progress with skilled physical therapy as evidenced by his subjective reports, objective measures, functional mobility, and progress toward his goals. He has been able to demonstrate a significant increase in right ankle active range of motion. He is also able to demonstrate improved function with activities such as navigating stairs. However, he continues to experience difficulty with activities such as running. Today's interventions focused on improved ankle stability needed for returning to soccer. He required minimal cueing with these interventions for proper exercise performance. He reported feeling good upon the conclusion of treatment. Recommend that he continue with skilled physical therapy to address his remaining impairments to return to his prior level of function.   OBJECTIVE IMPAIRMENTS: Abnormal gait, decreased activity tolerance, decreased balance, decreased endurance, decreased mobility, difficulty walking, decreased ROM, decreased strength, hypomobility, increased edema, impaired sensation, and pain.   ACTIVITY LIMITATIONS: carrying, lifting, standing, squatting, stairs, dressing, and locomotion level  PARTICIPATION LIMITATIONS: meal prep, cleaning,  laundry, shopping, and community activity  PERSONAL FACTORS: Time since onset of injury/illness/exacerbation and 1 comorbidity: allergies  are also affecting patient's functional outcome.   REHAB POTENTIAL: Good  CLINICAL DECISION MAKING: Evolving/moderate complexity  EVALUATION COMPLEXITY: Moderate   GOALS: Goals reviewed with patient? Yes  SHORT TERM GOALS: Target date: 03/22/23 Patient will be independent with his initial HEP.  Baseline: Goal status: MET  2.  Patient will be able to demonstrate right ankle dorsiflexion to within 10 degrees of neutral for improved gait mechanics.  Baseline: 5 degrees of dorsiflexion Goal status: MET  3.  Patient will improve his single leg stance time on his right lower extremity to at least 12 seconds for improved right ankle stability.  Baseline: 16.67 seconds Goal status: MET  4.  Patient will improve his right ankle eversion to at least 18 degrees for improved function walking on uneven terrain.  Baseline: 24 degress Goal status: MET  LONG TERM GOALS: Target date: 04/12/23  Patient will be independent with his advanced HEP.  Baseline:  Goal status: IN PROGRESS  2.  Patient will improve his right ankle dorsiflexion to at least neutral  for improved ankle mobility.  Baseline:  Goal status: MET  3.  Patient will be able to ambulate with no significant gait deviations.  Baseline:  Goal status: PARTIALLY MET  4.  Patient will be able to navigate at least 4 steps with a reciprocal pattern for improved household mobility.  Baseline:  Goal status: MET  5.  Patient will be able to demonstrate at least 30 repetitions of single leg heel raises on his right lower extremity for improved strength and endurance needed for functional activities such as running.  Baseline: 9/24: 18 reps 04/03/23: 22 reps Goal status: IN PROGRESS  6.  Patient will be able to run with no significant gait deviations.  Baseline:  Goal status: IN  PROGRESS   PLAN:  PT FREQUENCY: 2x/week  PT DURATION: 6 weeks  PLANNED INTERVENTIONS: Therapeutic exercises, Therapeutic activity, Neuromuscular re-education, Balance training, Gait training, Patient/Family education, Self Care, Joint mobilization, Stair training, Electrical stimulation, Cryotherapy, Moist heat, Vasopneumatic device, Manual therapy, and Re-evaluation  PLAN FOR NEXT SESSION: Nustep, review and update HEP as needed, right ankle AROM and PROM, gait training (wean out of CAM boot, as able), and modalities as needed   Granville Lewis, PT 04/03/2023, 12:53 PM

## 2023-04-05 ENCOUNTER — Ambulatory Visit: Payer: 59

## 2023-04-05 DIAGNOSIS — M25571 Pain in right ankle and joints of right foot: Secondary | ICD-10-CM | POA: Diagnosis not present

## 2023-04-05 DIAGNOSIS — M25671 Stiffness of right ankle, not elsewhere classified: Secondary | ICD-10-CM | POA: Diagnosis not present

## 2023-04-05 NOTE — Therapy (Signed)
OUTPATIENT PHYSICAL THERAPY LOWER EXTREMITY TREATMENT   Patient Name: Dean Cross MRN: [id number] DOB:17-Jul-1980, 42 y.o., male Today's Date: 04/05/2023  END OF SESSION:  PT End of Session - 04/05/23 1643     Visit Number 11    Number of Visits 12    Date for PT Re-Evaluation 05/25/23    PT Start Time 1638    PT Stop Time 1718    PT Time Calculation (min) 40 min    Activity Tolerance Patient tolerated treatment well    Behavior During Therapy WFL for tasks assessed/performed                    Past Medical History:  Diagnosis Date   Allergy    celiac's and seasonal   Celiac disease    GERD (gastroesophageal reflux disease)    Past Surgical History:  Procedure Laterality Date   APPENDECTOMY     1992   FINGER AMPUTATION  2000   HERNIA REPAIR  2011   b/l inguinal hernia    ORIF ANKLE FRACTURE Right 01/16/2023   Procedure: OPEN REDUCTION INTERNAL FIXATION (ORIF) ANKLE FRACTURE;  Surgeon: Sheral Apley, MD;  Location: [facility];  Service: Orthopedics;  Laterality: Right;   VASECTOMY  2018   Patient Active Problem List   Diagnosis Date Noted   Closed displaced trimalleolar fracture of right ankle 01/16/2023   Dyslipidemia 11/12/2019   GERD (gastroesophageal reflux disease) 08/06/2018   Seasonal allergies 08/06/2018   Celiac disease 12/09/2015   Family history of polycystic kidney disease 08/03/2015    PCP: Ardith Dark, MD  REFERRING PROVIDER: Sheral Apley, MD  REFERRING DIAG: s/p right ankle trimalleolar fracture ORIF 01/16/23  THERAPY DIAG:  Stiffness of right ankle, not elsewhere classified  Pain in right ankle and joints of right foot  Rationale for Evaluation and Treatment: Rehabilitation  ONSET DATE: 01/16/23  SUBJECTIVE:   SUBJECTIVE STATEMENT: Patient reports that his whole legs are sore today, but primarily his hamstrings due to running with his son after his last appointment. He was able to run about a  10-11 minute mile.    PERTINENT HISTORY: Allergies PAIN:  Are you having pain? Yes: NPRS scale: "minimal"/10 Pain location: right ankle  Pain description: "twinge"  Aggravating factors: certain ankle movements Relieving factors: medication   PRECAUTIONS: None  RED FLAGS: None   WEIGHT BEARING RESTRICTIONS: No  FALLS:  Has patient fallen in last 6 months? No  LIVING ENVIRONMENT: Lives with: lives with their family Lives in: House/apartment Stairs: Yes: Internal: 15 steps; can reach both and External: 3 steps; none; reciprocal pattern ascending and step to pattern descending Has following equipment at home:  CAM boot  OCCUPATION: physician  PLOF: Independent  PATIENT GOALS: return to soccer (twice per week for 1 hour), running (2-3 miles prior to injury), and lifting weights  NEXT MD VISIT: 04/04/23  OBJECTIVE:   PATIENT SURVEYS:  FOTO 69.90 on 03/29/23  COGNITION: Overall cognitive status: Within functional limits for tasks assessed     SENSATION: Patient reports some numbness along the dorsal surface of his right foot.   EDEMA:  Circumferential: R: 55 cm L: 54.5 cm  PALPATION: TTP: right achilles tendon and fibularis longus  JOINT MOBILITY:  Right talocrural: hypomobile and nonpainful   Right midfoot: hypomobile and nonpainful   Right subtalar: hypomobile and nonpainful   LOWER EXTREMITY ROM:  Active ROM Right eval Left eval Right 04/03/23  Hip flexion  Hip extension     Hip abduction     Hip adduction     Hip internal rotation     Hip external rotation     Knee flexion     Knee extension     Ankle dorsiflexion -20 0 -3  Ankle plantarflexion 35 55 40  Ankle inversion 25 30 38  Ankle eversion 5 27 10    (Blank rows = not tested)  LOWER EXTREMITY MMT:  MMT Right eval Left eval  Hip flexion    Hip extension    Hip abduction    Hip adduction    Hip internal rotation    Hip external rotation    Knee flexion    Knee extension    Ankle  dorsiflexion 4/5; "very tight"  5/5  Ankle plantarflexion    Ankle inversion 3+/5; limited by pain  5/5  Ankle eversion 3+/5 5/5   (Blank rows = not tested)  FUNCTIONAL TESTS:  L single leg heel raise: 49 reps in 45.38 seconds  BALANCE:  R SLS on firm surface: 6 seconds  L SLS on firm surface: 30 seconds  GAIT: Assistive device utilized: None Level of assistance: Complete Independence Comments: poor weight shift onto right lower extremity and toe off on the right foot due to limited right ankle dorsiflexion   TODAY'S TREATMENT:                                                                                                                              DATE:                                     04/05/23 EXERCISE LOG  Exercise Repetitions and Resistance Comments  Elliptical  L3/R3 x 7 minutes forward and backward    Squatting  2 minutes  With feet on inclined wedge  Standing gastroc stretch  4 x 30 seconds  RLE only   Heel raise on step  2 minutes  4" step   Hopping  15 reps    Lunges onto BOSU (ball up)  1.5 minutes each  Intermittent UE support   Marching on BOSU (ball up) 3 minutes  Intermittent UE support    Blank cell = exercise not performed today                                    04/03/23 EXERCISE LOG  Exercise Repetitions and Resistance Comments  Elliptical  L3/R3 x 7 minutes forward and backward    Standing gastroc stretch  4 x 30 seconds  RLE only   Marching on BOSU (ball up)   3 minutes  Without UE support   Lateral step up with push off (BOSU ball up) 3 minutes  Intermittent UE support   Cybex leg press  2 plates;  seat 6 x 3.5 minutes  2 x 1 minute with RLE and 1.5 minutes with BLE       Blank cell = exercise not performed today                                    03/29/23 EXERCISE LOG  Exercise Repetitions and Resistance Comments  Elliptical  L3/R3 x 6 minutes forward and backward   Ball toss  3 minutes On rocker board; intermittent UE support   Rocker board   3 minutes  Intermittent UE support   Toe raise  2 minutes    SLS on foam 3 x 30 seconds each    Standing gastroc stretch  2 minutes   Standing soleus stretch  3 minutes   Ankle circles 3 minutes   CW and CCW    Blank cell = exercise not performed today   PATIENT EDUCATION:  Education details: healing, progress with therapy, and objective findings Person educated: Patient Education method: Explanation Education comprehension: verbalized understanding  HOME EXERCISE PROGRAM: [id number]  ASSESSMENT:  CLINICAL IMPRESSION: Patient was progressed with lunges onto the BOSU in addition to familiar interventions. He required minimal cueing with today's interventions for proper exercise performance. He experienced a mild increase in right ankle soreness with today's interventions, but this did not limit his ability to complete any of today's interventions. He reported that his ankle felt sore upon the conclusion of treatment. He continues to require skilled physical therapy to address his remaining impairments to return to his prior level of function.   OBJECTIVE IMPAIRMENTS: Abnormal gait, decreased activity tolerance, decreased balance, decreased endurance, decreased mobility, difficulty walking, decreased ROM, decreased strength, hypomobility, increased edema, impaired sensation, and pain.   ACTIVITY LIMITATIONS: carrying, lifting, standing, squatting, stairs, dressing, and locomotion level  PARTICIPATION LIMITATIONS: meal prep, cleaning, laundry, shopping, and community activity  PERSONAL FACTORS: Time since onset of injury/illness/exacerbation and 1 comorbidity: allergies  are also affecting patient's functional outcome.   REHAB POTENTIAL: Good  CLINICAL DECISION MAKING: Evolving/moderate complexity  EVALUATION COMPLEXITY: Moderate   GOALS: Goals reviewed with patient? Yes  SHORT TERM GOALS: Target date: 03/22/23 Patient will be independent with his initial HEP.  Baseline: Goal  status: MET  2.  Patient will be able to demonstrate right ankle dorsiflexion to within 10 degrees of neutral for improved gait mechanics.  Baseline: 5 degrees of dorsiflexion Goal status: MET  3.  Patient will improve his single leg stance time on his right lower extremity to at least 12 seconds for improved right ankle stability.  Baseline: 16.67 seconds Goal status: MET  4.  Patient will improve his right ankle eversion to at least 18 degrees for improved function walking on uneven terrain.  Baseline: 24 degress Goal status: MET  LONG TERM GOALS: Target date: 04/12/23  Patient will be independent with his advanced HEP.  Baseline:  Goal status: IN PROGRESS  2.  Patient will improve his right ankle dorsiflexion to at least neutral for improved ankle mobility.  Baseline:  Goal status: MET  3.  Patient will be able to ambulate with no significant gait deviations.  Baseline:  Goal status: PARTIALLY MET  4.  Patient will be able to navigate at least 4 steps with a reciprocal pattern for improved household mobility.  Baseline:  Goal status: MET  5.  Patient will be able to demonstrate at least 30 repetitions of single leg heel  raises on his right lower extremity for improved strength and endurance needed for functional activities such as running.  Baseline: 9/24: 18 reps 04/03/23: 22 reps Goal status: IN PROGRESS  6.  Patient will be able to run with no significant gait deviations.  Baseline:  Goal status: IN PROGRESS   PLAN:  PT FREQUENCY: 2x/week  PT DURATION: 6 weeks  PLANNED INTERVENTIONS: Therapeutic exercises, Therapeutic activity, Neuromuscular re-education, Balance training, Gait training, Patient/Family education, Self Care, Joint mobilization, Stair training, Electrical stimulation, Cryotherapy, Moist heat, Vasopneumatic device, Manual therapy, and Re-evaluation  PLAN FOR NEXT SESSION: Nustep, review and update HEP as needed, right ankle AROM and PROM, gait  training (wean out of CAM boot, as able), and modalities as needed   Granville Lewis, PT 04/05/2023, 5:40 PM

## 2023-04-10 ENCOUNTER — Ambulatory Visit: Payer: 59

## 2023-04-10 DIAGNOSIS — M25571 Pain in right ankle and joints of right foot: Secondary | ICD-10-CM

## 2023-04-10 DIAGNOSIS — M25671 Stiffness of right ankle, not elsewhere classified: Secondary | ICD-10-CM

## 2023-04-10 NOTE — Therapy (Signed)
OUTPATIENT PHYSICAL THERAPY LOWER EXTREMITY TREATMENT   Patient Name: Dean Cross MRN: [id number] DOB:May 09, 1981, 42 y.o., male Today's Date: 04/10/2023  END OF SESSION:  PT End of Session - 04/10/23 1102     Visit Number 12    Number of Visits 12    Date for PT Re-Evaluation 05/25/23    PT Start Time 1100    PT Stop Time 1145    PT Time Calculation (min) 45 min    Activity Tolerance Patient tolerated treatment well    Behavior During Therapy WFL for tasks assessed/performed                    Past Medical History:  Diagnosis Date   Allergy    celiac's and seasonal   Celiac disease    GERD (gastroesophageal reflux disease)    Past Surgical History:  Procedure Laterality Date   APPENDECTOMY     1992   FINGER AMPUTATION  2000   HERNIA REPAIR  2011   b/l inguinal hernia    ORIF ANKLE FRACTURE Right 01/16/2023   Procedure: OPEN REDUCTION INTERNAL FIXATION (ORIF) ANKLE FRACTURE;  Surgeon: Sheral Apley, MD;  Location: [facility];  Service: Orthopedics;  Laterality: Right;   VASECTOMY  2018   Patient Active Problem List   Diagnosis Date Noted   Closed displaced trimalleolar fracture of right ankle 01/16/2023   Dyslipidemia 11/12/2019   GERD (gastroesophageal reflux disease) 08/06/2018   Seasonal allergies 08/06/2018   Celiac disease 12/09/2015   Family history of polycystic kidney disease 08/03/2015    PCP: Ardith Dark, MD  REFERRING PROVIDER: Sheral Apley, MD  REFERRING DIAG: s/p right ankle trimalleolar fracture ORIF 01/16/23  THERAPY DIAG:  Stiffness of right ankle, not elsewhere classified  Pain in right ankle and joints of right foot  Rationale for Evaluation and Treatment: Rehabilitation  ONSET DATE: 01/16/23  SUBJECTIVE:   SUBJECTIVE STATEMENT: Patient reports 5-6/10 lateral right ankle pain.  Reports kicking the soccer ball with his son.   PERTINENT HISTORY: Allergies PAIN:  Are you having pain? Yes:  NPRS scale: 5-6/10 Pain location: right ankle  Pain description: "twinge"  Aggravating factors: certain ankle movements Relieving factors: medication   PRECAUTIONS: None  RED FLAGS: None   WEIGHT BEARING RESTRICTIONS: No  FALLS:  Has patient fallen in last 6 months? No  LIVING ENVIRONMENT: Lives with: lives with their family Lives in: House/apartment Stairs: Yes: Internal: 15 steps; can reach both and External: 3 steps; none; reciprocal pattern ascending and step to pattern descending Has following equipment at home:  CAM boot  OCCUPATION: physician  PLOF: Independent  PATIENT GOALS: return to soccer (twice per week for 1 hour), running (2-3 miles prior to injury), and lifting weights  NEXT MD VISIT: 04/04/23  OBJECTIVE:   PATIENT SURVEYS:  FOTO 69.90 on 03/29/23  COGNITION: Overall cognitive status: Within functional limits for tasks assessed     SENSATION: Patient reports some numbness along the dorsal surface of his right foot.   EDEMA:  Circumferential: R: 55 cm L: 54.5 cm  PALPATION: TTP: right achilles tendon and fibularis longus  JOINT MOBILITY:  Right talocrural: hypomobile and nonpainful   Right midfoot: hypomobile and nonpainful   Right subtalar: hypomobile and nonpainful   LOWER EXTREMITY ROM:  Active ROM Right eval Left eval Right 04/03/23  Hip flexion     Hip extension     Hip abduction     Hip adduction  Hip internal rotation     Hip external rotation     Knee flexion     Knee extension     Ankle dorsiflexion -20 0 -3  Ankle plantarflexion 35 55 40  Ankle inversion 25 30 38  Ankle eversion 5 27 10    (Blank rows = not tested)  LOWER EXTREMITY MMT:  MMT Right eval Left eval  Hip flexion    Hip extension    Hip abduction    Hip adduction    Hip internal rotation    Hip external rotation    Knee flexion    Knee extension    Ankle dorsiflexion 4/5; "very tight"  5/5  Ankle plantarflexion    Ankle inversion 3+/5; limited by  pain  5/5  Ankle eversion 3+/5 5/5   (Blank rows = not tested)  FUNCTIONAL TESTS:  L single leg heel raise: 49 reps in 45.38 seconds  BALANCE:  R SLS on firm surface: 6 seconds  L SLS on firm surface: 30 seconds  GAIT: Assistive device utilized: None Level of assistance: Complete Independence Comments: poor weight shift onto right lower extremity and toe off on the right foot due to limited right ankle dorsiflexion   TODAY'S TREATMENT:                                                                                                                              DATE:                                    04/10/23 EXERCISE LOG  Exercise Repetitions and Resistance Comments  Elliptical  L3/R3 x 10 mins forward and backward   Treadmill 6 mins; 5.8 mph   SLS Heel Raise x30 reps   SLS Cones 5 cones x 2 reps   Scooter Flexion 157 ft   SLS Stars 3 reps    Side Stepping Green x 4 reps (tband at ankle)   Monster Walk Green x 4 reps   Leg Press Heel Raises 2 plates x 2 mins    Blank cell = exercise not performed today                                    04/05/23 EXERCISE LOG  Exercise Repetitions and Resistance Comments  Elliptical  L3/R3 x 7 minutes forward and backward    Squatting  2 minutes  With feet on inclined wedge  Standing gastroc stretch  4 x 30 seconds  RLE only   Heel raise on step  2 minutes  4" step   Hopping  15 reps    Lunges onto BOSU (ball up)  1.5 minutes each  Intermittent UE support   Marching on BOSU (ball up) 3 minutes  Intermittent UE support    Blank cell = exercise  not performed today                                    04/03/23 EXERCISE LOG  Exercise Repetitions and Resistance Comments  Elliptical  L3/R3 x 7 minutes forward and backward    Standing gastroc stretch  4 x 30 seconds  RLE only   Marching on BOSU (ball up)   3 minutes  Without UE support   Lateral step up with push off (BOSU ball up) 3 minutes  Intermittent UE support   Cybex leg press  2  plates; seat 6 x 3.5 minutes  2 x 1 minute with RLE and 1.5 minutes with BLE       Blank cell = exercise not performed today                                  PATIENT EDUCATION:  Education details: healing, progress with therapy, and objective findings Person educated: Patient Education method: Explanation Education comprehension: verbalized understanding  HOME EXERCISE PROGRAM: [id number]  ASSESSMENT:  CLINICAL IMPRESSION: Pt arrives for today's treatment session reporting 5-6/10 lateral right ankle pain.  Pt introduced to jogging on treadmill with pt controlling speed.  Pt able to reach 5.8 mph today.  Pt able to demonstrate 30 single leg heel raises today meeting his long term goal. Pt challenged with single leg activities today.  Pt able to tolerate calf raises on leg press with minimal discomfort and fatigue.  Pt reported mild decrease in pain at completion of today's treatment session.   OBJECTIVE IMPAIRMENTS: Abnormal gait, decreased activity tolerance, decreased balance, decreased endurance, decreased mobility, difficulty walking, decreased ROM, decreased strength, hypomobility, increased edema, impaired sensation, and pain.   ACTIVITY LIMITATIONS: carrying, lifting, standing, squatting, stairs, dressing, and locomotion level  PARTICIPATION LIMITATIONS: meal prep, cleaning, laundry, shopping, and community activity  PERSONAL FACTORS: Time since onset of injury/illness/exacerbation and 1 comorbidity: allergies  are also affecting patient's functional outcome.   REHAB POTENTIAL: Good  CLINICAL DECISION MAKING: Evolving/moderate complexity  EVALUATION COMPLEXITY: Moderate   GOALS: Goals reviewed with patient? Yes  SHORT TERM GOALS: Target date: 03/22/23 Patient will be independent with his initial HEP.  Baseline: Goal status: MET  2.  Patient will be able to demonstrate right ankle dorsiflexion to within 10 degrees of neutral for improved gait mechanics.  Baseline: 5  degrees of dorsiflexion Goal status: MET  3.  Patient will improve his single leg stance time on his right lower extremity to at least 12 seconds for improved right ankle stability.  Baseline: 16.67 seconds Goal status: MET  4.  Patient will improve his right ankle eversion to at least 18 degrees for improved function walking on uneven terrain.  Baseline: 24 degress Goal status: MET  LONG TERM GOALS: Target date: 04/12/23  Patient will be independent with his advanced HEP.  Baseline:  Goal status: IN PROGRESS  2.  Patient will improve his right ankle dorsiflexion to at least neutral for improved ankle mobility.  Baseline:  Goal status: MET  3.  Patient will be able to ambulate with no significant gait deviations.  Baseline:  Goal status: PARTIALLY MET  4.  Patient will be able to navigate at least 4 steps with a reciprocal pattern for improved household mobility.  Baseline:  Goal status: MET  5.  Patient will be able to  demonstrate at least 30 repetitions of single leg heel raises on his right lower extremity for improved strength and endurance needed for functional activities such as running.  Baseline: 9/24: 18 reps 04/03/23: 22 reps; 10/15: 30 reps Goal status: MET  6.  Patient will be able to run with no significant gait deviations.  Baseline:  Goal status: IN PROGRESS   PLAN:  PT FREQUENCY: 2x/week  PT DURATION: 6 weeks  PLANNED INTERVENTIONS: Therapeutic exercises, Therapeutic activity, Neuromuscular re-education, Balance training, Gait training, Patient/Family education, Self Care, Joint mobilization, Stair training, Electrical stimulation, Cryotherapy, Moist heat, Vasopneumatic device, Manual therapy, and Re-evaluation  PLAN FOR NEXT SESSION: Nustep, review and update HEP as needed, right ankle AROM and PROM, gait training (wean out of CAM boot, as able), and modalities as needed   Newman Pies, PTA 04/10/2023, 11:52 AM

## 2023-04-12 ENCOUNTER — Ambulatory Visit: Payer: 59

## 2023-04-12 DIAGNOSIS — M25671 Stiffness of right ankle, not elsewhere classified: Secondary | ICD-10-CM

## 2023-04-12 DIAGNOSIS — M25571 Pain in right ankle and joints of right foot: Secondary | ICD-10-CM | POA: Diagnosis not present

## 2023-04-12 NOTE — Therapy (Signed)
OUTPATIENT PHYSICAL THERAPY LOWER EXTREMITY TREATMENT   Patient Name: Dean Cross MRN: [id number] DOB:December 06, 1980, 42 y.o., male Today's Date: 04/12/2023  END OF SESSION:  PT End of Session - 04/12/23 1649     Visit Number 13    Number of Visits 16    Date for PT Re-Evaluation 05/25/23    PT Start Time 1645    PT Stop Time 1730    PT Time Calculation (min) 45 min    Activity Tolerance Patient tolerated treatment well    Behavior During Therapy WFL for tasks assessed/performed                     Past Medical History:  Diagnosis Date   Allergy    celiac's and seasonal   Celiac disease    GERD (gastroesophageal reflux disease)    Past Surgical History:  Procedure Laterality Date   APPENDECTOMY     1992   FINGER AMPUTATION  2000   HERNIA REPAIR  2011   b/l inguinal hernia    ORIF ANKLE FRACTURE Right 01/16/2023   Procedure: OPEN REDUCTION INTERNAL FIXATION (ORIF) ANKLE FRACTURE;  Surgeon: Sheral Apley, MD;  Location: [facility];  Service: Orthopedics;  Laterality: Right;   VASECTOMY  2018   Patient Active Problem List   Diagnosis Date Noted   Closed displaced trimalleolar fracture of right ankle 01/16/2023   Dyslipidemia 11/12/2019   GERD (gastroesophageal reflux disease) 08/06/2018   Seasonal allergies 08/06/2018   Celiac disease 12/09/2015   Family history of polycystic kidney disease 08/03/2015    PCP: Ardith Dark, MD  REFERRING PROVIDER: Sheral Apley, MD  REFERRING DIAG: s/p right ankle trimalleolar fracture ORIF 01/16/23  THERAPY DIAG:  Stiffness of right ankle, not elsewhere classified  Pain in right ankle and joints of right foot  Rationale for Evaluation and Treatment: Rehabilitation  ONSET DATE: 01/16/23  SUBJECTIVE:   SUBJECTIVE STATEMENT: Patient reports that his calf has been sore this week.   PERTINENT HISTORY: Allergies PAIN:  Are you having pain? Yes: NPRS scale: 4-5/10 Pain location: right  ankle  Pain description: "twinge"  Aggravating factors: certain ankle movements Relieving factors: medication   PRECAUTIONS: None  RED FLAGS: None   WEIGHT BEARING RESTRICTIONS: No  FALLS:  Has patient fallen in last 6 months? No  LIVING ENVIRONMENT: Lives with: lives with their family Lives in: House/apartment Stairs: Yes: Internal: 15 steps; can reach both and External: 3 steps; none; reciprocal pattern ascending and step to pattern descending Has following equipment at home:  CAM boot  OCCUPATION: physician  PLOF: Independent  PATIENT GOALS: return to soccer (twice per week for 1 hour), running (2-3 miles prior to injury), and lifting weights  NEXT MD VISIT: 04/04/23  OBJECTIVE:   PATIENT SURVEYS:  FOTO 69.90 on 03/29/23  COGNITION: Overall cognitive status: Within functional limits for tasks assessed     SENSATION: Patient reports some numbness along the dorsal surface of his right foot.   EDEMA:  Circumferential: R: 55 cm L: 54.5 cm  PALPATION: TTP: right achilles tendon and fibularis longus  JOINT MOBILITY:  Right talocrural: hypomobile and nonpainful   Right midfoot: hypomobile and nonpainful   Right subtalar: hypomobile and nonpainful   LOWER EXTREMITY ROM:  Active ROM Right eval Left eval Right 04/03/23  Hip flexion     Hip extension     Hip abduction     Hip adduction     Hip internal rotation  Hip external rotation     Knee flexion     Knee extension     Ankle dorsiflexion -20 0 -3  Ankle plantarflexion 35 55 40  Ankle inversion 25 30 38  Ankle eversion 5 27 10    (Blank rows = not tested)  LOWER EXTREMITY MMT:  MMT Right eval Left eval  Hip flexion    Hip extension    Hip abduction    Hip adduction    Hip internal rotation    Hip external rotation    Knee flexion    Knee extension    Ankle dorsiflexion 4/5; "very tight"  5/5  Ankle plantarflexion    Ankle inversion 3+/5; limited by pain  5/5  Ankle eversion 3+/5 5/5    (Blank rows = not tested)  FUNCTIONAL TESTS:  L single leg heel raise: 49 reps in 45.38 seconds  BALANCE:  R SLS on firm surface: 6 seconds  L SLS on firm surface: 30 seconds  GAIT: Assistive device utilized: None Level of assistance: Complete Independence Comments: poor weight shift onto right lower extremity and toe off on the right foot due to limited right ankle dorsiflexion   TODAY'S TREATMENT:                                                                                                                              DATE:                                     04/12/23 EXERCISE LOG  Exercise Repetitions and Resistance Comments  Treadmill 7 minutes @ 5.6 mph   Standing gastroc stretch  4 x 30 seconds  RLE only   SL heel raise  30 reps  RLE only   Lunges onto BOSU Ball up; 3 minutes  Alternating LE   Static stance on BOSU Ball up; 3 minutes   Standing toe raise  25 reps     Blank cell = exercise not performed today  Manual Therapy Soft Tissue Mobilization: right gastrocnemius, soleus, and achilles tendon, for reduced pain and tone                                     04/10/23 EXERCISE LOG  Exercise Repetitions and Resistance Comments  Elliptical  L3/R3 x 10 mins forward and backward   Treadmill 6 mins; 5.8 mph   SLS Heel Raise x30 reps   SLS Cones 5 cones x 2 reps   Scooter Flexion 157 ft   SLS Stars 3 reps    Side Stepping Green x 4 reps (tband at ankle)   Monster Walk Green x 4 reps   Leg Press Heel Raises 2 plates x 2 mins    Blank cell = exercise not performed today  04/05/23 EXERCISE LOG  Exercise Repetitions and Resistance Comments  Elliptical  L3/R3 x 7 minutes forward and backward    Squatting  2 minutes  With feet on inclined wedge  Standing gastroc stretch  4 x 30 seconds  RLE only   Heel raise on step  2 minutes  4" step   Hopping  15 reps    Lunges onto BOSU (ball up)  1.5 minutes each  Intermittent UE support    Marching on BOSU (ball up) 3 minutes  Intermittent UE support    Blank cell = exercise not performed today        PATIENT EDUCATION:  Education details: healing, progress with therapy, and objective findings Person educated: Patient Education method: Explanation Education comprehension: verbalized understanding  HOME EXERCISE PROGRAM: [id number]  ASSESSMENT:  CLINICAL IMPRESSION: Patient presented to treatment reporting increased right gastrocnemius and Achilles tendon soreness.  Interventions such as running on the treadmill, single-leg heel raises, and lunges onto the BOSU slightly aggravated his familiar symptoms, but this did not inhibit his ability to complete any of these interventions.  Soft tissue mobilization to his right calf and Achilles tendon was able to slightly reduce his familiar symptoms.  He was educated on the importance of rest and the continued benefits of ice.  He reported understanding.  He reported feeling "alright" upon the conclusion of treatment.  He continues to require skilled physical therapy to address his remaining impairments to return to his prior level of function.  OBJECTIVE IMPAIRMENTS: Abnormal gait, decreased activity tolerance, decreased balance, decreased endurance, decreased mobility, difficulty walking, decreased ROM, decreased strength, hypomobility, increased edema, impaired sensation, and pain.   ACTIVITY LIMITATIONS: carrying, lifting, standing, squatting, stairs, dressing, and locomotion level  PARTICIPATION LIMITATIONS: meal prep, cleaning, laundry, shopping, and community activity  PERSONAL FACTORS: Time since onset of injury/illness/exacerbation and 1 comorbidity: allergies  are also affecting patient's functional outcome.   REHAB POTENTIAL: Good  CLINICAL DECISION MAKING: Evolving/moderate complexity  EVALUATION COMPLEXITY: Moderate   GOALS: Goals reviewed with patient? Yes  SHORT TERM GOALS: Target date: 03/22/23 Patient will be  independent with his initial HEP.  Baseline: Goal status: MET  2.  Patient will be able to demonstrate right ankle dorsiflexion to within 10 degrees of neutral for improved gait mechanics.  Baseline: 5 degrees of dorsiflexion Goal status: MET  3.  Patient will improve his single leg stance time on his right lower extremity to at least 12 seconds for improved right ankle stability.  Baseline: 16.67 seconds Goal status: MET  4.  Patient will improve his right ankle eversion to at least 18 degrees for improved function walking on uneven terrain.  Baseline: 24 degress Goal status: MET  LONG TERM GOALS: Target date: 04/12/23  Patient will be independent with his advanced HEP.  Baseline:  Goal status: IN PROGRESS  2.  Patient will improve his right ankle dorsiflexion to at least neutral for improved ankle mobility.  Baseline:  Goal status: MET  3.  Patient will be able to ambulate with no significant gait deviations.  Baseline:  Goal status: PARTIALLY MET  4.  Patient will be able to navigate at least 4 steps with a reciprocal pattern for improved household mobility.  Baseline:  Goal status: MET  5.  Patient will be able to demonstrate at least 30 repetitions of single leg heel raises on his right lower extremity for improved strength and endurance needed for functional activities such as running.  Baseline: 9/24: 18 reps 04/03/23: 22  reps; 10/15: 30 reps Goal status: MET  6.  Patient will be able to run with no significant gait deviations.  Baseline:  Goal status: IN PROGRESS   PLAN:  PT FREQUENCY: 2x/week  PT DURATION: 6 weeks  PLANNED INTERVENTIONS: Therapeutic exercises, Therapeutic activity, Neuromuscular re-education, Balance training, Gait training, Patient/Family education, Self Care, Joint mobilization, Stair training, Electrical stimulation, Cryotherapy, Moist heat, Vasopneumatic device, Manual therapy, and Re-evaluation  PLAN FOR NEXT SESSION: Nustep, review and  update HEP as needed, right ankle AROM and PROM, gait training (wean out of CAM boot, as able), and modalities as needed   Granville Lewis, PT 04/12/2023, 6:03 PM

## 2023-04-19 ENCOUNTER — Ambulatory Visit: Payer: 59

## 2023-04-19 DIAGNOSIS — M25571 Pain in right ankle and joints of right foot: Secondary | ICD-10-CM

## 2023-04-19 DIAGNOSIS — M25671 Stiffness of right ankle, not elsewhere classified: Secondary | ICD-10-CM | POA: Diagnosis not present

## 2023-04-19 NOTE — Therapy (Signed)
OUTPATIENT PHYSICAL THERAPY LOWER EXTREMITY TREATMENT   Patient Name: Dean Cross MRN: [id number] DOB:18-Feb-1981, 42 y.o., male Today's Date: 04/19/2023  END OF SESSION:  PT End of Session - 04/19/23 1652     Visit Number 14    Number of Visits 16    Date for PT Re-Evaluation 05/25/23    PT Start Time 1645    PT Stop Time 1730    PT Time Calculation (min) 45 min    Activity Tolerance Patient tolerated treatment well    Behavior During Therapy WFL for tasks assessed/performed               Past Medical History:  Diagnosis Date   Allergy    celiac's and seasonal   Celiac disease    GERD (gastroesophageal reflux disease)    Past Surgical History:  Procedure Laterality Date   APPENDECTOMY     1992   FINGER AMPUTATION  2000   HERNIA REPAIR  2011   b/l inguinal hernia    ORIF ANKLE FRACTURE Right 01/16/2023   Procedure: OPEN REDUCTION INTERNAL FIXATION (ORIF) ANKLE FRACTURE;  Surgeon: Sheral Apley, MD;  Location: [facility];  Service: Orthopedics;  Laterality: Right;   VASECTOMY  2018   Patient Active Problem List   Diagnosis Date Noted   Closed displaced trimalleolar fracture of right ankle 01/16/2023   Dyslipidemia 11/12/2019   GERD (gastroesophageal reflux disease) 08/06/2018   Seasonal allergies 08/06/2018   Celiac disease 12/09/2015   Family history of polycystic kidney disease 08/03/2015    PCP: Ardith Dark, MD  REFERRING PROVIDER: Sheral Apley, MD  REFERRING DIAG: s/p right ankle trimalleolar fracture ORIF 01/16/23  THERAPY DIAG:  Stiffness of right ankle, not elsewhere classified  Pain in right ankle and joints of right foot  Rationale for Evaluation and Treatment: Rehabilitation  ONSET DATE: 01/16/23  SUBJECTIVE:   SUBJECTIVE STATEMENT: Pt report 2/10 right ankle pain.  Pt reports 7-8/10 right ankle pain over the weekend.    PERTINENT HISTORY: Allergies PAIN:  Are you having pain? Yes: NPRS scale:  2/10 Pain location: right ankle  Pain description: "twinge"  Aggravating factors: certain ankle movements Relieving factors: medication   PRECAUTIONS: None  RED FLAGS: None   WEIGHT BEARING RESTRICTIONS: No  FALLS:  Has patient fallen in last 6 months? No  LIVING ENVIRONMENT: Lives with: lives with their family Lives in: House/apartment Stairs: Yes: Internal: 15 steps; can reach both and External: 3 steps; none; reciprocal pattern ascending and step to pattern descending Has following equipment at home:  CAM boot  OCCUPATION: physician  PLOF: Independent  PATIENT GOALS: return to soccer (twice per week for 1 hour), running (2-3 miles prior to injury), and lifting weights  NEXT MD VISIT: 04/04/23  OBJECTIVE:   PATIENT SURVEYS:  FOTO 69.90 on 03/29/23  COGNITION: Overall cognitive status: Within functional limits for tasks assessed     SENSATION: Patient reports some numbness along the dorsal surface of his right foot.   EDEMA:  Circumferential: R: 55 cm L: 54.5 cm  PALPATION: TTP: right achilles tendon and fibularis longus  JOINT MOBILITY:  Right talocrural: hypomobile and nonpainful   Right midfoot: hypomobile and nonpainful   Right subtalar: hypomobile and nonpainful   LOWER EXTREMITY ROM:  Active ROM Right eval Left eval Right 04/03/23  Hip flexion     Hip extension     Hip abduction     Hip adduction     Hip internal rotation  Hip external rotation     Knee flexion     Knee extension     Ankle dorsiflexion -20 0 -3  Ankle plantarflexion 35 55 40  Ankle inversion 25 30 38  Ankle eversion 5 27 10    (Blank rows = not tested)  LOWER EXTREMITY MMT:  MMT Right eval Left eval  Hip flexion    Hip extension    Hip abduction    Hip adduction    Hip internal rotation    Hip external rotation    Knee flexion    Knee extension    Ankle dorsiflexion 4/5; "very tight"  5/5  Ankle plantarflexion    Ankle inversion 3+/5; limited by pain  5/5   Ankle eversion 3+/5 5/5   (Blank rows = not tested)  FUNCTIONAL TESTS:  L single leg heel raise: 49 reps in 45.38 seconds  BALANCE:  R SLS on firm surface: 6 seconds  L SLS on firm surface: 30 seconds  GAIT: Assistive device utilized: None Level of assistance: Complete Independence Comments: poor weight shift onto right lower extremity and toe off on the right foot due to limited right ankle dorsiflexion   TODAY'S TREATMENT:                                                                                                                              DATE:                                     04/19/23 EXERCISE LOG  Exercise Repetitions and Resistance Comments  Elliptical  L5/L5 x 12 mins (6 mins forward/backward)   Standing gastroc stretch  5 x 30 seconds  RLE only   SL squats 30 reps  RLE only   Lunges onto BOSU Ball up; 3 minutes  Alternating LE   Split squats 25 reps   Standing toe raise  25 reps    Agility Ladder Two Feet; Ins and Outs; Up Over Back, Diagonals; Hop On One Foot   Towel Stretch 5 reps x 30 reps    Blank cell = exercise not performed today                                     04/10/23 EXERCISE LOG  Exercise Repetitions and Resistance Comments  Elliptical  L3/R3 x 10 mins forward and backward   Treadmill 6 mins; 5.8 mph   SLS Heel Raise x30 reps   SLS Cones 5 cones x 2 reps   Scooter Flexion 157 ft   SLS Stars 3 reps    Side Stepping Green x 4 reps (tband at ankle)   Monster Walk Green x 4 reps   Leg Press Heel Raises 2 plates x 2 mins    Blank cell = exercise not performed  today                                         PATIENT EDUCATION:  Education details: healing, progress with therapy, and objective findings Person educated: Patient Education method: Explanation Education comprehension: verbalized understanding  HOME EXERCISE PROGRAM: [id number]  ASSESSMENT:  CLINICAL IMPRESSION: Pt arrives for today's treatment session reporting 2/10 right  ankle pain.  Pt reported that pain increased to 7-8/10 over the weekend after performing treadmill last week.  Pt introduced to agility ladder today with mild discomfort.  Pt able to tolerate single leg and split squats today with minimal difficulty.  Towel stretch performed at completion of other exercises.  Pt denied any change in pain at completion of today's treatment session.  OBJECTIVE IMPAIRMENTS: Abnormal gait, decreased activity tolerance, decreased balance, decreased endurance, decreased mobility, difficulty walking, decreased ROM, decreased strength, hypomobility, increased edema, impaired sensation, and pain.   ACTIVITY LIMITATIONS: carrying, lifting, standing, squatting, stairs, dressing, and locomotion level  PARTICIPATION LIMITATIONS: meal prep, cleaning, laundry, shopping, and community activity  PERSONAL FACTORS: Time since onset of injury/illness/exacerbation and 1 comorbidity: allergies  are also affecting patient's functional outcome.   REHAB POTENTIAL: Good  CLINICAL DECISION MAKING: Evolving/moderate complexity  EVALUATION COMPLEXITY: Moderate   GOALS: Goals reviewed with patient? Yes  SHORT TERM GOALS: Target date: 03/22/23 Patient will be independent with his initial HEP.  Baseline: Goal status: MET  2.  Patient will be able to demonstrate right ankle dorsiflexion to within 10 degrees of neutral for improved gait mechanics.  Baseline: 5 degrees of dorsiflexion Goal status: MET  3.  Patient will improve his single leg stance time on his right lower extremity to at least 12 seconds for improved right ankle stability.  Baseline: 16.67 seconds Goal status: MET  4.  Patient will improve his right ankle eversion to at least 18 degrees for improved function walking on uneven terrain.  Baseline: 24 degress Goal status: MET  LONG TERM GOALS: Target date: 04/12/23  Patient will be independent with his advanced HEP.  Baseline:  Goal status: IN PROGRESS  2.   Patient will improve his right ankle dorsiflexion to at least neutral for improved ankle mobility.  Baseline:  Goal status: MET  3.  Patient will be able to ambulate with no significant gait deviations.  Baseline:  Goal status: PARTIALLY MET  4.  Patient will be able to navigate at least 4 steps with a reciprocal pattern for improved household mobility.  Baseline:  Goal status: MET  5.  Patient will be able to demonstrate at least 30 repetitions of single leg heel raises on his right lower extremity for improved strength and endurance needed for functional activities such as running.  Baseline: 9/24: 18 reps 04/03/23: 22 reps; 10/15: 30 reps Goal status: MET  6.  Patient will be able to run with no significant gait deviations.  Baseline:  Goal status: IN PROGRESS   PLAN:  PT FREQUENCY: 2x/week  PT DURATION: 6 weeks  PLANNED INTERVENTIONS: Therapeutic exercises, Therapeutic activity, Neuromuscular re-education, Balance training, Gait training, Patient/Family education, Self Care, Joint mobilization, Stair training, Electrical stimulation, Cryotherapy, Moist heat, Vasopneumatic device, Manual therapy, and Re-evaluation  PLAN FOR NEXT SESSION: Nustep, review and update HEP as needed, right ankle AROM and PROM, gait training (wean out of CAM boot, as able), and modalities as needed   Newman Pies, PTA 04/19/2023,  6:01 PM

## 2023-05-03 ENCOUNTER — Ambulatory Visit: Payer: 59 | Attending: Orthopedic Surgery

## 2023-05-03 DIAGNOSIS — M25671 Stiffness of right ankle, not elsewhere classified: Secondary | ICD-10-CM | POA: Diagnosis not present

## 2023-05-03 DIAGNOSIS — M25571 Pain in right ankle and joints of right foot: Secondary | ICD-10-CM | POA: Insufficient documentation

## 2023-05-03 NOTE — Therapy (Signed)
OUTPATIENT PHYSICAL THERAPY LOWER EXTREMITY TREATMENT   Patient Name: Dean Cross MRN: [id number] DOB:[birth year], 42 y.o., male Today's Date: 05/03/2023  END OF SESSION:  PT End of Session - 05/03/23 1646     Visit Number 15    Number of Visits 16    Date for PT Re-Evaluation 05/25/23    PT Start Time 1645    PT Stop Time 1730    PT Time Calculation (min) 45 min    Activity Tolerance Patient tolerated treatment well    Behavior During Therapy WFL for tasks assessed/performed               Past Medical History:  Diagnosis Date   Allergy    celiac's and seasonal   Celiac disease    GERD (gastroesophageal reflux disease)    Past Surgical History:  Procedure Laterality Date   APPENDECTOMY     1992   FINGER AMPUTATION  2000   HERNIA REPAIR  2011   b/l inguinal hernia    ORIF ANKLE FRACTURE Right 01/16/2023   Procedure: OPEN REDUCTION INTERNAL FIXATION (ORIF) ANKLE FRACTURE;  Surgeon: Sheral Apley, MD;  Location: [facility];  Service: Orthopedics;  Laterality: Right;   VASECTOMY  2018   Patient Active Problem List   Diagnosis Date Noted   Closed displaced trimalleolar fracture of right ankle 01/16/2023   Dyslipidemia 11/12/2019   GERD (gastroesophageal reflux disease) 08/06/2018   Seasonal allergies 08/06/2018   Celiac disease 12/09/2015   Family history of polycystic kidney disease 08/03/2015    PCP: Ardith Dark, MD  REFERRING PROVIDER: Sheral Apley, MD  REFERRING DIAG: s/p right ankle trimalleolar fracture ORIF 01/16/23  THERAPY DIAG:  Stiffness of right ankle, not elsewhere classified  Pain in right ankle and joints of right foot  Rationale for Evaluation and Treatment: Rehabilitation  ONSET DATE: 01/16/23  SUBJECTIVE:   SUBJECTIVE STATEMENT: Pt report 1-2/10 right ankle pain.  Pt reports playing soccer with his kids on Tuesday with minimal issue.   PERTINENT HISTORY: Allergies PAIN:  Are you having pain? Yes:  NPRS scale: 1-2/10 Pain location: right ankle  Pain description: "twinge"  Aggravating factors: certain ankle movements Relieving factors: medication   PRECAUTIONS: None  RED FLAGS: None   WEIGHT BEARING RESTRICTIONS: No  FALLS:  Has patient fallen in last 6 months? No  LIVING ENVIRONMENT: Lives with: lives with their family Lives in: House/apartment Stairs: Yes: Internal: 15 steps; can reach both and External: 3 steps; none; reciprocal pattern ascending and step to pattern descending Has following equipment at home:  CAM boot  OCCUPATION: physician  PLOF: Independent  PATIENT GOALS: return to soccer (twice per week for 1 hour), running (2-3 miles prior to injury), and lifting weights  NEXT MD VISIT: 04/04/23  OBJECTIVE:   PATIENT SURVEYS:  FOTO 69.90 on 03/29/23  COGNITION: Overall cognitive status: Within functional limits for tasks assessed     SENSATION: Patient reports some numbness along the dorsal surface of his right foot.   EDEMA:  Circumferential: R: 55 cm L: 54.5 cm  PALPATION: TTP: right achilles tendon and fibularis longus  JOINT MOBILITY:  Right talocrural: hypomobile and nonpainful   Right midfoot: hypomobile and nonpainful   Right subtalar: hypomobile and nonpainful   LOWER EXTREMITY ROM:  Active ROM Right eval Left eval Right 04/03/23  Hip flexion     Hip extension     Hip abduction     Hip adduction     Hip internal  rotation     Hip external rotation     Knee flexion     Knee extension     Ankle dorsiflexion -20 0 -3  Ankle plantarflexion 35 55 40  Ankle inversion 25 30 38  Ankle eversion 5 27 10    (Blank rows = not tested)  LOWER EXTREMITY MMT:  MMT Right eval Left eval  Hip flexion    Hip extension    Hip abduction    Hip adduction    Hip internal rotation    Hip external rotation    Knee flexion    Knee extension    Ankle dorsiflexion 4/5; "very tight"  5/5  Ankle plantarflexion    Ankle inversion 3+/5; limited by  pain  5/5  Ankle eversion 3+/5 5/5   (Blank rows = not tested)  FUNCTIONAL TESTS:  L single leg heel raise: 49 reps in 45.38 seconds  BALANCE:  R SLS on firm surface: 6 seconds  L SLS on firm surface: 30 seconds  GAIT: Assistive device utilized: None Level of assistance: Complete Independence Comments: poor weight shift onto right lower extremity and toe off on the right foot due to limited right ankle dorsiflexion   TODAY'S TREATMENT:                                                                                                                              DATE:                                    05/03/23 EXERCISE LOG  Exercise Repetitions and Resistance Comments  Elliptical  L5/L5 x 12 mins (6 mins forward/backward)   Ramp Up and Down x 3   Standing gastroc stretch  5 x 30 seconds  RLE only   SL squats Airex x 20 reps RLE only   Lunges onto BOSU Ball up; 5 minutes  Alternating LE   Split squats    SLS STS 15 reps   Step Down  6" box x 20 reps   SLS Weighted Ball 4# x 3 mins    Blank cell = exercise not performed today                                     04/10/23 EXERCISE LOG  Exercise Repetitions and Resistance Comments  Elliptical  L3/R3 x 10 mins forward and backward   Treadmill 6 mins; 5.8 mph   SLS Heel Raise x30 reps   SLS Cones 5 cones x 2 reps   Scooter Flexion 157 ft   SLS Stars 3 reps    Side Stepping Green x 4 reps (tband at ankle)   Monster Walk Green x 4 reps   Leg Press Heel Raises 2 plates x 2 mins    Blank cell = exercise  not performed today                                         PATIENT EDUCATION:  Education details: healing, progress with therapy, and objective findings Person educated: Patient Education method: Explanation Education comprehension: verbalized understanding  HOME EXERCISE PROGRAM: [id number]  ASSESSMENT:  CLINICAL IMPRESSION: Pt arrives for today's treatment session reporting 1-2/10 right ankle pain.  Pt report playing  soccer with his children earlier in the week with minimal issue.  Today's treatment session concentrated on single leg exercises to further strengthen right ankle.  Pt hopes to walk his normal route through his neighborhood prior to next treatment and note ankle progression.  Pt challenged by single STS from lower padded chair.  Pt denied any change in pain at completion of today's treatment session.  OBJECTIVE IMPAIRMENTS: Abnormal gait, decreased activity tolerance, decreased balance, decreased endurance, decreased mobility, difficulty walking, decreased ROM, decreased strength, hypomobility, increased edema, impaired sensation, and pain.   ACTIVITY LIMITATIONS: carrying, lifting, standing, squatting, stairs, dressing, and locomotion level  PARTICIPATION LIMITATIONS: meal prep, cleaning, laundry, shopping, and community activity  PERSONAL FACTORS: Time since onset of injury/illness/exacerbation and 1 comorbidity: allergies  are also affecting patient's functional outcome.   REHAB POTENTIAL: Good  CLINICAL DECISION MAKING: Evolving/moderate complexity  EVALUATION COMPLEXITY: Moderate   GOALS: Goals reviewed with patient? Yes  SHORT TERM GOALS: Target date: 03/22/23 Patient will be independent with his initial HEP.  Baseline: Goal status: MET  2.  Patient will be able to demonstrate right ankle dorsiflexion to within 10 degrees of neutral for improved gait mechanics.  Baseline: 5 degrees of dorsiflexion Goal status: MET  3.  Patient will improve his single leg stance time on his right lower extremity to at least 12 seconds for improved right ankle stability.  Baseline: 16.67 seconds Goal status: MET  4.  Patient will improve his right ankle eversion to at least 18 degrees for improved function walking on uneven terrain.  Baseline: 24 degress Goal status: MET  LONG TERM GOALS: Target date: 04/12/23  Patient will be independent with his advanced HEP.  Baseline:  Goal status: IN  PROGRESS  2.  Patient will improve his right ankle dorsiflexion to at least neutral for improved ankle mobility.  Baseline:  Goal status: MET  3.  Patient will be able to ambulate with no significant gait deviations.  Baseline:  Goal status: PARTIALLY MET  4.  Patient will be able to navigate at least 4 steps with a reciprocal pattern for improved household mobility.  Baseline:  Goal status: MET  5.  Patient will be able to demonstrate at least 30 repetitions of single leg heel raises on his right lower extremity for improved strength and endurance needed for functional activities such as running.  Baseline: 9/24: 18 reps 04/03/23: 22 reps; 10/15: 30 reps Goal status: MET  6.  Patient will be able to run with no significant gait deviations.  Baseline:  Goal status: IN PROGRESS   PLAN:  PT FREQUENCY: 2x/week  PT DURATION: 6 weeks  PLANNED INTERVENTIONS: Therapeutic exercises, Therapeutic activity, Neuromuscular re-education, Balance training, Gait training, Patient/Family education, Self Care, Joint mobilization, Stair training, Electrical stimulation, Cryotherapy, Moist heat, Vasopneumatic device, Manual therapy, and Re-evaluation  PLAN FOR NEXT SESSION: Nustep, review and update HEP as needed, right ankle AROM and PROM, gait training (wean out of CAM boot, as able),  and modalities as needed   Newman Pies, PTA 05/03/2023, 5:37 PM

## 2023-05-10 ENCOUNTER — Ambulatory Visit: Payer: 59

## 2023-05-10 DIAGNOSIS — M25671 Stiffness of right ankle, not elsewhere classified: Secondary | ICD-10-CM | POA: Diagnosis not present

## 2023-05-10 DIAGNOSIS — M25571 Pain in right ankle and joints of right foot: Secondary | ICD-10-CM | POA: Diagnosis not present

## 2023-05-10 NOTE — Therapy (Addendum)
OUTPATIENT PHYSICAL THERAPY LOWER EXTREMITY TREATMENT   Patient Name: Dean Cross MRN: [id number] DOB:1981/03/09, 42 y.o., male Today's Date: 05/10/2023  END OF SESSION:  PT End of Session - 05/10/23 1648     Visit Number 16    Number of Visits 16    Date for PT Re-Evaluation 05/25/23    PT Start Time 1645    PT Stop Time 1725    PT Time Calculation (min) 40 min    Activity Tolerance Patient tolerated treatment well    Behavior During Therapy WFL for tasks assessed/performed               Past Medical History:  Diagnosis Date   Allergy    celiac's and seasonal   Celiac disease    GERD (gastroesophageal reflux disease)    Past Surgical History:  Procedure Laterality Date   APPENDECTOMY     1992   FINGER AMPUTATION  2000   HERNIA REPAIR  2011   b/l inguinal hernia    ORIF ANKLE FRACTURE Right 01/16/2023   Procedure: OPEN REDUCTION INTERNAL FIXATION (ORIF) ANKLE FRACTURE;  Surgeon: Sheral Apley, MD;  Location: [facility];  Service: Orthopedics;  Laterality: Right;   VASECTOMY  2018   Patient Active Problem List   Diagnosis Date Noted   Closed displaced trimalleolar fracture of right ankle 01/16/2023   Dyslipidemia 11/12/2019   GERD (gastroesophageal reflux disease) 08/06/2018   Seasonal allergies 08/06/2018   Celiac disease 12/09/2015   Family history of polycystic kidney disease 08/03/2015    PCP: Ardith Dark, MD  REFERRING PROVIDER: Sheral Apley, MD  REFERRING DIAG: s/p right ankle trimalleolar fracture ORIF 01/16/23  THERAPY DIAG:  Stiffness of right ankle, not elsewhere classified  Pain in right ankle and joints of right foot  Rationale for Evaluation and Treatment: Rehabilitation  ONSET DATE: 01/16/23  SUBJECTIVE:   SUBJECTIVE STATEMENT: Pt report 2-3/10 right ankle pain.  Pt ready for discharge today.   PERTINENT HISTORY: Allergies PAIN:  Are you having pain? Yes: NPRS scale: 2-3/10 Pain location: right  ankle  Pain description: "twinge"  Aggravating factors: certain ankle movements Relieving factors: medication   PRECAUTIONS: None  RED FLAGS: None   WEIGHT BEARING RESTRICTIONS: No  FALLS:  Has patient fallen in last 6 months? No  LIVING ENVIRONMENT: Lives with: lives with their family Lives in: House/apartment Stairs: Yes: Internal: 15 steps; can reach both and External: 3 steps; none; reciprocal pattern ascending and step to pattern descending Has following equipment at home:  CAM boot  OCCUPATION: physician  PLOF: Independent  PATIENT GOALS: return to soccer (twice per week for 1 hour), running (2-3 miles prior to injury), and lifting weights  NEXT MD VISIT: 04/04/23  OBJECTIVE:   PATIENT SURVEYS:  FOTO 69.90 on 03/29/23  COGNITION: Overall cognitive status: Within functional limits for tasks assessed     SENSATION: Patient reports some numbness along the dorsal surface of his right foot.   EDEMA:  Circumferential: R: 55 cm L: 54.5 cm  PALPATION: TTP: right achilles tendon and fibularis longus  JOINT MOBILITY:  Right talocrural: hypomobile and nonpainful   Right midfoot: hypomobile and nonpainful   Right subtalar: hypomobile and nonpainful   LOWER EXTREMITY ROM:  Active ROM Right eval Left eval Right 04/03/23  Hip flexion     Hip extension     Hip abduction     Hip adduction     Hip internal rotation     Hip external  rotation     Knee flexion     Knee extension     Ankle dorsiflexion -20 0 -3  Ankle plantarflexion 35 55 40  Ankle inversion 25 30 38  Ankle eversion 5 27 10    (Blank rows = not tested)  LOWER EXTREMITY MMT:  MMT Right eval Left eval  Hip flexion    Hip extension    Hip abduction    Hip adduction    Hip internal rotation    Hip external rotation    Knee flexion    Knee extension    Ankle dorsiflexion 4/5; "very tight"  5/5  Ankle plantarflexion    Ankle inversion 3+/5; limited by pain  5/5  Ankle eversion 3+/5 5/5    (Blank rows = not tested)  FUNCTIONAL TESTS:  L single leg heel raise: 49 reps in 45.38 seconds  BALANCE:  R SLS on firm surface: 6 seconds  L SLS on firm surface: 30 seconds  GAIT: Assistive device utilized: None Level of assistance: Complete Independence Comments: poor weight shift onto right lower extremity and toe off on the right foot due to limited right ankle dorsiflexion   TODAY'S TREATMENT:                                                                                                                              DATE:                                    05/10/23 EXERCISE LOG  Exercise Repetitions and Resistance Comments  Elliptical  L5/L5 x 12 mins (6 mins forward/backward)   SLS Heel Raises 1 min (79 reps)   Standing gastroc stretch  5 x 30 seconds  RLE only   SL squats  RLE only   Lunges onto BOSU  Alternating LE   Split squats    SLS STS 15 reps   Step Down  6" box x 25 reps   SLS Weighted Ball 8# x 3 mins    Blank cell = exercise not performed today                                     04/10/23 EXERCISE LOG  Exercise Repetitions and Resistance Comments  Elliptical  L3/R3 x 10 mins forward and backward   Treadmill 6 mins; 5.8 mph   SLS Heel Raise x30 reps   SLS Cones 5 cones x 2 reps   Scooter Flexion 157 ft   SLS Stars 3 reps    Side Stepping Green x 4 reps (tband at ankle)   Monster Walk Green x 4 reps   Leg Press Heel Raises 2 plates x 2 mins    Blank cell = exercise not performed today  PATIENT EDUCATION:  Education details: healing, progress with therapy, and objective findings Person educated: Patient Education method: Explanation Education comprehension: verbalized understanding  HOME EXERCISE PROGRAM: [id number]  ASSESSMENT:  CLINICAL IMPRESSION: Pt arrives for today's treatment session reporting 2-3/10 right ankle pain.  Pt able to increase FOTO score to 81 today, surpassing his predicted score.  Pt  able to perform 79 single leg heel raises in a min on his right ankle.  Pt reports mild gait deviation with jogging, thus meeting all of his goals, but that one at this time.  Pt encouraged to call the facility with any questions or concerns.  Pt ready for discharge at this time.   PHYSICAL THERAPY DISCHARGE SUMMARY  Visits from Start of Care: 16  Current functional level related to goals / functional outcomes: Patient was able to meet most of his goals for skilled physical therapy. He reported feeling comfortable being discharged at this time.    Remaining deficits: Psychiatrist / Equipment: HEP   Patient agrees to discharge. Patient goals were partially met. Patient is being discharged due to being pleased with the current functional level.  Candi Leash, PT, DPT    OBJECTIVE IMPAIRMENTS: Abnormal gait, decreased activity tolerance, decreased balance, decreased endurance, decreased mobility, difficulty walking, decreased ROM, decreased strength, hypomobility, increased edema, impaired sensation, and pain.   ACTIVITY LIMITATIONS: carrying, lifting, standing, squatting, stairs, dressing, and locomotion level  PARTICIPATION LIMITATIONS: meal prep, cleaning, laundry, shopping, and community activity  PERSONAL FACTORS: Time since onset of injury/illness/exacerbation and 1 comorbidity: allergies  are also affecting patient's functional outcome.   REHAB POTENTIAL: Good  CLINICAL DECISION MAKING: Evolving/moderate complexity  EVALUATION COMPLEXITY: Moderate   GOALS: Goals reviewed with patient? Yes  SHORT TERM GOALS: Target date: 03/22/23 Patient will be independent with his initial HEP.  Baseline: Goal status: MET  2.  Patient will be able to demonstrate right ankle dorsiflexion to within 10 degrees of neutral for improved gait mechanics.  Baseline: 5 degrees of dorsiflexion Goal status: MET  3.  Patient will improve his single leg stance time on his right  lower extremity to at least 12 seconds for improved right ankle stability.  Baseline: 16.67 seconds Goal status: MET  4.  Patient will improve his right ankle eversion to at least 18 degrees for improved function walking on uneven terrain.  Baseline: 24 degress Goal status: MET  LONG TERM GOALS: Target date: 04/12/23  Patient will be independent with his advanced HEP.  Baseline:  Goal status: MET  2.  Patient will improve his right ankle dorsiflexion to at least neutral for improved ankle mobility.  Baseline:  Goal status: MET  3.  Patient will be able to ambulate with no significant gait deviations.  Baseline:  Goal status: MET  4.  Patient will be able to navigate at least 4 steps with a reciprocal pattern for improved household mobility.  Baseline:  Goal status: MET  5.  Patient will be able to demonstrate at least 30 repetitions of single leg heel raises on his right lower extremity for improved strength and endurance needed for functional activities such as running.  Baseline: 9/24: 18 reps 04/03/23: 22 reps; 10/15: 30 reps Goal status: MET  6.  Patient will be able to run with no significant gait deviations.  Baseline:  Goal status: IN PROGRESS   PLAN:  PT FREQUENCY: 2x/week  PT DURATION: 6 weeks  PLANNED INTERVENTIONS: Therapeutic exercises, Therapeutic activity, Neuromuscular re-education, Balance training, Gait training, Patient/Family  education, Self Care, Joint mobilization, Stair training, Electrical stimulation, Cryotherapy, Moist heat, Vasopneumatic device, Manual therapy, and Re-evaluation  PLAN FOR NEXT SESSION: Nustep, review and update HEP as needed, right ankle AROM and PROM, gait training (wean out of CAM boot, as able), and modalities as needed   Newman Pies, PTA 05/10/2023, 5:37 PM

## 2023-09-27 ENCOUNTER — Other Ambulatory Visit: Payer: Self-pay

## 2023-12-25 ENCOUNTER — Ambulatory Visit (INDEPENDENT_AMBULATORY_CARE_PROVIDER_SITE_OTHER): Admitting: Family Medicine

## 2023-12-25 ENCOUNTER — Encounter: Payer: Self-pay | Admitting: Family Medicine

## 2023-12-25 VITALS — BP 110/80 | HR 50 | Temp 97.3°F | Ht 71.0 in | Wt 202.2 lb

## 2023-12-25 DIAGNOSIS — Z0000 Encounter for general adult medical examination without abnormal findings: Secondary | ICD-10-CM | POA: Diagnosis not present

## 2023-12-25 DIAGNOSIS — J302 Other seasonal allergic rhinitis: Secondary | ICD-10-CM | POA: Diagnosis not present

## 2023-12-25 DIAGNOSIS — K219 Gastro-esophageal reflux disease without esophagitis: Secondary | ICD-10-CM | POA: Diagnosis not present

## 2023-12-25 DIAGNOSIS — E785 Hyperlipidemia, unspecified: Secondary | ICD-10-CM

## 2023-12-25 NOTE — Patient Instructions (Signed)
 It was very nice to see you today!  No changes today.  Keep up the great work!  We will monitor for your next physical.  Please come back sooner if needed.  Return in about 1 year (around 12/24/2024) for Annual Physical.   Take care, Dr Kennyth  PLEASE NOTE:  If you had any lab tests, please let us  know if you have not heard back within a few days. You may see your results on mychart before we have a chance to review them but we will give you a call once they are reviewed by us .   If we ordered any referrals today, please let us  know if you have not heard from their office within the next week.   If you had any urgent prescriptions sent in today, please check with the pharmacy within an hour of our visit to make sure the prescription was transmitted appropriately.   Please try these tips to maintain a healthy lifestyle:  Eat at least 3 REAL meals and 1-2 snacks per day.  Aim for no more than 5 hours between eating.  If you eat breakfast, please do so within one hour of getting up.   Each meal should contain half fruits/vegetables, one quarter protein, and one quarter carbs (no bigger than a computer mouse)  Cut down on sweet beverages. This includes juice, soda, and sweet tea.   Drink at least 1 glass of water with each meal and aim for at least 8 glasses per day  Exercise at least 150 minutes every week.    Preventive Care 70-61 Years Old, Male Preventive care refers to lifestyle choices and visits with your health care provider that can promote health and wellness. Preventive care visits are also called wellness exams. What can I expect for my preventive care visit? Counseling During your preventive care visit, your health care provider may ask about your: Medical history, including: Past medical problems. Family medical history. Current health, including: Emotional well-being. Home life and relationship well-being. Sexual activity. Lifestyle, including: Alcohol, nicotine  or tobacco, and drug use. Access to firearms. Diet, exercise, and sleep habits. Safety issues such as seatbelt and bike helmet use. Sunscreen use. Work and work Astronomer. Physical exam Your health care provider will check your: Height and weight. These may be used to calculate your BMI (body mass index). BMI is a measurement that tells if you are at a healthy weight. Waist circumference. This measures the distance around your waistline. This measurement also tells if you are at a healthy weight and may help predict your risk of certain diseases, such as type 2 diabetes and high blood pressure. Heart rate and blood pressure. Body temperature. Skin for abnormal spots. What immunizations do I need?  Vaccines are usually given at various ages, according to a schedule. Your health care provider will recommend vaccines for you based on your age, medical history, and lifestyle or other factors, such as travel or where you work. What tests do I need? Screening Your health care provider may recommend screening tests for certain conditions. This may include: Lipid and cholesterol levels. Diabetes screening. This is done by checking your blood sugar (glucose) after you have not eaten for a while (fasting). Hepatitis B test. Hepatitis C test. HIV (human immunodeficiency virus) test. STI (sexually transmitted infection) testing, if you are at risk. Lung cancer screening. Prostate cancer screening. Colorectal cancer screening. Talk with your health care provider about your test results, treatment options, and if necessary, the need for  more tests. Follow these instructions at home: Eating and drinking  Eat a diet that includes fresh fruits and vegetables, whole grains, lean protein, and low-fat dairy products. Take vitamin and mineral supplements as recommended by your health care provider. Do not drink alcohol if your health care provider tells you not to drink. If you drink alcohol: Limit  how much you have to 0-2 drinks a day. Know how much alcohol is in your drink. In the [place] one drink equals one 12 oz bottle of beer (355 mL), one 5 oz glass of wine (148 mL), or one 1 oz glass of hard liquor (44 mL). Lifestyle Brush your teeth every morning and night with fluoride toothpaste. Floss one time each day. Exercise for at least 30 minutes 5 or more days each week. Do not use any products that contain nicotine or tobacco. These products include cigarettes, chewing tobacco, and vaping devices, such as e-cigarettes. If you need help quitting, ask your health care provider. Do not use drugs. If you are sexually active, practice safe sex. Use a condom or other form of protection to prevent STIs. Take aspirin  only as told by your health care provider. Make sure that you understand how much to take and what form to take. Work with your health care provider to find out whether it is safe and beneficial for you to take aspirin  daily. Find healthy ways to manage stress, such as: Meditation, yoga, or listening to music. Journaling. Talking to a trusted person. Spending time with friends and family. Minimize exposure to UV radiation to reduce your risk of skin cancer. Safety Always wear your seat belt while driving or riding in a vehicle. Do not drive: If you have been drinking alcohol. Do not ride with someone who has been drinking. When you are tired or distracted. While texting. If you have been using any mind-altering substances or drugs. Wear a helmet and other protective equipment during sports activities. If you have firearms in your house, make sure you follow all gun safety procedures. What's next? Go to your health care provider once a year for an annual wellness visit. Ask your health care provider how often you should have your eyes and teeth checked. Stay up to date on all vaccines. This information is not intended to replace advice given to you by your health care  provider. Make sure you discuss any questions you have with your health care provider. Document Revised: 12/08/2020 Document Reviewed: 12/08/2020 Elsevier Patient Education  2024 [employer].

## 2023-12-25 NOTE — Assessment & Plan Note (Signed)
 Recent lipid panel at goal.  He is working on lifestyle interventions.  Recheck in a year.

## 2023-12-25 NOTE — Assessment & Plan Note (Signed)
Stable on Claritin and Flonase as needed seasonally.

## 2023-12-25 NOTE — Progress Notes (Signed)
 Chief Complaint:  Dean Cross is a 43 y.o. male who presents today for his annual comprehensive physical exam.    Assessment/Plan:  Chronic Problems Addressed Today: Seasonal allergies Stable on Claritin and Flonase as needed seasonally.  Dyslipidemia Recent lipid panel at goal.  He is working on lifestyle interventions.  Recheck in a year.  Preventative Healthcare: UpToDate on screening labs.  HPV declined.  Up-to-date on other vaccines.  Patient Counseling(The following topics were reviewed and/or handout was given):  -Nutrition: Stressed importance of moderation in sodium/caffeine intake, saturated fat and cholesterol, caloric balance, sufficient intake of fresh fruits, vegetables, and fiber.  -Stressed the importance of regular exercise.   -Substance Abuse: Discussed cessation/primary prevention of tobacco, alcohol, or other drug use; driving or other dangerous activities under the influence; availability of treatment for abuse.   -Injury prevention: Discussed safety belts, safety helmets, smoke detector, smoking near bedding or upholstery.   -Sexuality: Discussed sexually transmitted diseases, partner selection, use of condoms, avoidance of unintended pregnancy and contraceptive alternatives.   -Dental health: Discussed importance of regular tooth brushing, flossing, and dental visits.  -Health maintenance and immunizations reviewed. Please refer to Health maintenance section.  Return to care in 1 year for next preventative visit.     Subjective:  HPI:  He has no acute complaints today.  Patient suffered trimalleolar fracture last year.  Underwent open reduction internal fixation and has been work with PT.  He has had significant improvement though still has mild pain in mild decreased range of motion in his right ankle.  Lifestyle Diet: Balanced. Plenty of fruits and vegetable.  See you back in Exercise: Limited due to recent ankle fracture but trying to get back into.  Plays soccer routinely.      12/25/2023    8:08 AM  Depression screen PHQ 2/9  Decreased Interest 0  Down, Depressed, Hopeless 0  PHQ - 2 Score 0  Altered sleeping 0  Tired, decreased energy 0  Change in appetite 0  Feeling bad or failure about yourself  0  Trouble concentrating 0  Moving slowly or fidgety/restless 0  Suicidal thoughts 0  PHQ-9 Score 0  Difficult doing work/chores Not difficult at all    Health Maintenance Due  Topic Date Due   HIV Screening  Never done   Hepatitis C Screening  Never done   HPV VACCINES (1 - 3-dose SCDM series) Never done     ROS: Per HPI, otherwise a complete review of systems was negative.   PMH:  The following were reviewed and entered/updated in epic: Past Medical History:  Diagnosis Date   Allergy    celiac's and seasonal   Celiac disease    GERD (gastroesophageal reflux disease)    Patient Active Problem List   Diagnosis Date Noted   Closed displaced trimalleolar fracture of right ankle 01/16/2023   Dyslipidemia 11/12/2019   GERD (gastroesophageal reflux disease) 08/06/2018   Seasonal allergies 08/06/2018   Celiac disease 12/09/2015   Family history of polycystic kidney disease 08/03/2015   Past Surgical History:  Procedure Laterality Date   APPENDECTOMY     1992   FINGER AMPUTATION  2000   HERNIA REPAIR  2011   b/l inguinal hernia    ORIF ANKLE FRACTURE Right 01/16/2023   Procedure: OPEN REDUCTION INTERNAL FIXATION (ORIF) ANKLE FRACTURE;  Surgeon: Beverley Evalene BIRCH, MD;  Location: [facility];  Service: Orthopedics;  Laterality: Right;   VASECTOMY  2018    Family History  Problem Relation Age of Onset   Cancer Mother        thyroid    Hyperlipidemia Father    Polycystic kidney disease Father    Asthma Sister    Depression Sister    Celiac disease Sister    Arthritis Sister    Arnold-Chiari malformation Sister    Depression Sister    Heart disease Paternal Grandfather    Hyperlipidemia Paternal  Grandfather    Lung cancer Maternal Grandmother    Prostate cancer Neg Hx    Colon cancer Neg Hx     Medications- reviewed and updated Current Outpatient Medications  Medication Sig Dispense Refill   acetaminophen  (TYLENOL ) 500 MG tablet Take 2 tablets (1,000 mg total) by mouth every 6 (six) hours as needed for mild pain or moderate pain. 60 tablet 0   fluticasone (FLONASE) 50 MCG/ACT nasal spray Place into both nostrils daily.     loratadine (CLARITIN) 10 MG tablet Take 10 mg by mouth daily.     meloxicam  (MOBIC ) 15 MG tablet Take 1 tablet (15 mg total) by mouth daily as needed for pain (and inflammation). 90 tablet 1   No current facility-administered medications for this visit.    Allergies-reviewed and updated No Known Allergies  Social History   Socioeconomic History   Marital status: Married    Spouse name: Not on file   Number of children: Not on file   Years of education: Not on file   Highest education level: Professional school degree (e.g., MD, DDS, DVM, JD)  Occupational History   Not on file  Tobacco Use   Smoking status: Never   Smokeless tobacco: Never  Vaping Use   Vaping status: Never Used  Substance and Sexual Activity   Alcohol use: No   Drug use: No   Sexual activity: Yes  Other Topics Concern   Not on file  Social History Narrative   Not on file   Social Drivers of Health   Financial Resource Strain: Low Risk  (12/24/2023)   Overall Financial Resource Strain (CARDIA)    Difficulty of Paying Living Expenses: Not hard at all  Food Insecurity: No Food Insecurity (12/24/2023)   Hunger Vital Sign    Worried About Running Out of Food in the Last Year: Never true    Ran Out of Food in the Last Year: Never true  Transportation Needs: No Transportation Needs (12/24/2023)   PRAPARE - Administrator, Civil Service (Medical): No    Lack of Transportation (Non-Medical): No  Physical Activity: Insufficiently Active (12/24/2023)   Exercise Vital  Sign    Days of Exercise per Week: 2 days    Minutes of Exercise per Session: 60 min  Stress: No Stress Concern Present (12/24/2023)   [employer] of Occupational Health - Occupational Stress Questionnaire    Feeling of Stress: Not at all  Social Connections: Socially Integrated (12/24/2023)   Social Connection and Isolation Panel    Frequency of Communication with Friends and Family: Twice a week    Frequency of Social Gatherings with Friends and Family: Once a week    Attends Religious Services: More than 4 times per year    Active Member of [employer] or Organizations: Yes    Attends Engineer, structural: More than 4 times per year    Marital Status: Married        Objective:  Physical Exam: BP 110/80   Pulse (!) 50   Temp (!) 97.3 F (36.3 C)  Ht 5' 11 (1.803 m)   Wt 202 lb 3.2 oz (91.7 kg)   SpO2 97%   BMI 28.20 kg/m   Body mass index is 28.2 kg/m. Wt Readings from Last 3 Encounters:  12/25/23 202 lb 3.2 oz (91.7 kg)  01/16/23 200 lb 2.8 oz (90.8 kg)  09/12/22 199 lb 12.8 oz (90.6 kg)   Gen: NAD, resting comfortably HEENT: TMs normal bilaterally. OP clear. No thyromegaly noted.  CV: RRR with no murmurs appreciated Pulm: NWOB, CTAB with no crackles, wheezes, or rhonchi GI: Normal bowel sounds present. Soft, Nontender, Nondistended. MSK: no edema, cyanosis, or clubbing noted Skin: warm, dry Neuro: CN2-12 grossly intact. Strength 5/5 in upper and lower extremities. Reflexes symmetric and intact bilaterally.  Psych: Normal affect and thought content     Siegfried Vieth M. Kennyth, MD 12/25/2023 8:45 AM

## 2024-04-03 DIAGNOSIS — H5111 Convergence insufficiency: Secondary | ICD-10-CM | POA: Diagnosis not present

## 2024-04-04 ENCOUNTER — Encounter: Payer: Self-pay | Admitting: Family Medicine

## 2024-04-04 NOTE — Telephone Encounter (Signed)
**Note De-identified  [place] Obfuscation** Please advise 

## 2024-04-07 NOTE — Telephone Encounter (Signed)
 See note

## 2024-04-07 NOTE — Telephone Encounter (Signed)
 I am happy to order either though we can probably get the MRI done before a neurology consult.

## 2024-04-08 ENCOUNTER — Encounter: Payer: Self-pay | Admitting: Neurology

## 2024-04-08 ENCOUNTER — Ambulatory Visit: Payer: Self-pay | Admitting: Family Medicine

## 2024-04-08 ENCOUNTER — Encounter: Payer: Self-pay | Admitting: Family Medicine

## 2024-04-08 ENCOUNTER — Ambulatory Visit (HOSPITAL_COMMUNITY)
Admission: RE | Admit: 2024-04-08 | Discharge: 2024-04-08 | Disposition: A | Discharge status: Home / Self Care | Source: Ambulatory Visit | Attending: Family Medicine | Admitting: Family Medicine

## 2024-04-08 ENCOUNTER — Ambulatory Visit (INDEPENDENT_AMBULATORY_CARE_PROVIDER_SITE_OTHER): Admitting: Family Medicine

## 2024-04-08 VITALS — BP 92/62 | HR 50 | Temp 98.0°F | Ht 71.0 in | Wt 187.2 lb

## 2024-04-08 DIAGNOSIS — H532 Diplopia: Secondary | ICD-10-CM | POA: Diagnosis not present

## 2024-04-08 NOTE — Progress Notes (Addendum)
   Dean Cross is a 43 y.o. male who presents today for an office visit.  Assessment/Plan:  Diplopia Unclear etiology though differential includes cranial nerve palsy or other extraocular muscle pathology.  Doubt cortical lesion given that his symptoms resolve with covering one eye. His exam is notable for lateral gaze nystagmus but otherwise his neuroexam is reassuring.  Also had optometry exam last week which was notable for exotropia but otherwise no significant abnormalities.  He is not currently having any other neurologic signs or symptoms.  We will obtain stat MRI and urgently refer to neurology.     Subjective:  HPI:  See assessment / plan for status of chronic conditions.   Discussed the use of AI scribe software for clinical note transcription with the patient, who gave verbal consent to proceed.  History of Present Illness Dean Cross is a 43 year old male who presents with new-onset double vision.  He began experiencing double vision suddenly two weeks ago. The double vision is particularly severe in the morning and worsens throughout the day, especially when looking at computer screens or in low light conditions. It improves when he closes one eye, regardless of which eye is closed.  He visited an optometrist last Thursday who conducted a full eye exam and identified a slight exotropia in the left eye. A minor correction for the left eye and blue screen lenses were suggested, but no significant refractive errors were found. His vision is better for distant objects, with more pronounced double vision when focusing on closer objects.  During a soccer game, he lost sight of the ball when it was close, resulting in it hitting his nose. No weakness, numbness, tingling, or headaches.         Objective:  Physical Exam: BP 92/62   Pulse (!) 50   Temp 98 F (36.7 C) (Temporal)   Ht 5' 11 (1.803 m)   Wt 187 lb 3.2 oz (84.9 kg)   SpO2 98%   BMI 26.11 kg/m   Gen:  No acute distress, resting comfortably CV: Regular rate and rhythm with no murmurs appreciated Pulm: Normal work of breathing, clear to auscultation bilaterally with no crackles, wheezes, or rhonchi Neuro: Horizontal nystagmus with lateral movement in the left eye otherwise cranial nerves II through XII intact.  Strength and sensation intact in upper and lower extremities. Psych: Normal affect and thought content      Dean Haught M. Kennyth, MD 04/08/2024 9:50 AM

## 2024-04-08 NOTE — Patient Instructions (Signed)
 It was very nice to see you today!  VISIT SUMMARY: You came in today because you have been experiencing double vision, which started suddenly last Monday. The double vision is worse in the morning and when using screens or in low light, but improves when you close one eye.  YOUR PLAN: DOUBLE VISION (DIPLOPIA) AND LEFT EYE EXOTROPIA: You have been experiencing double vision and a slight outward turning of your left eye, which worsens with fatigue and screen use. -We need to perform an MRI of your brain to rule out any serious conditions. This will be scheduled urgently. -You will be referred to a neurologist for further evaluation. -We will coordinate with the urgent referral pool to expedite your MRI scheduling.  Return if symptoms worsen or fail to improve.   Take care, Dr Kennyth  PLEASE NOTE:  If you had any lab tests, please let us  know if you have not heard back within a few days. You may see your results on mychart before we have a chance to review them but we will give you a call once they are reviewed by us .   If we ordered any referrals today, please let us  know if you have not heard from their office within the next week.   If you had any urgent prescriptions sent in today, please check with the pharmacy within an hour of our visit to make sure the prescription was transmitted appropriately.   Please try these tips to maintain a healthy lifestyle:  Eat at least 3 REAL meals and 1-2 snacks per day.  Aim for no more than 5 hours between eating.  If you eat breakfast, please do so within one hour of getting up.   Each meal should contain half fruits/vegetables, one quarter protein, and one quarter carbs (no bigger than a computer mouse)  Cut down on sweet beverages. This includes juice, soda, and sweet tea.   Drink at least 1 glass of water with each meal and aim for at least 8 glasses per day  Exercise at least 150 minutes every week.

## 2024-04-08 NOTE — Progress Notes (Signed)
 Good news! MRI brain does not show any acute abnormalities.  Recommend he follow-up with neurology ASAP-this referral has already been placed.  The MRI did show opacification in his right maxillary sinus.  We can send an antibiotic if he is having symptoms consistent with a sinus infection but I do not think that this is related to his double vision.

## 2024-04-15 NOTE — Progress Notes (Signed)
 Initial neurology clinic note  Dean Cross MRN: [id number] DOB: 1981-01-29  Referring provider: Kennyth Worth HERO, MD  Primary care provider: Kennyth Worth HERO, MD  Reason for consult:  diplopia  Subjective:  This is Dean Cross, a 43 y.o. right-handed male with a medical history of celiac disease, GERD who presents to neurology clinic with diplopia. The patient is alone today. 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  Patient first noticed symptoms about 3 weeks ago. He normally has no issues with his eyes. He was not someone who even needed glasses. He woke up one morning and had severe diplopia. Closely one eye would resolve symptoms. Symptoms persisted so he went to an optometrist in early 03/2024 and found exotropia of left eye. He got an MRI brain wo contrast on 04/08/24 that showed a normal brain or abnormality to explain diplopia, but right maxillary sinus had complete opacification. He got prism  glasses yesterday. He thinks this may help some, but not completely. He is still getting used to the glasses.  Symptoms seem to fluctuate. It is worse when he first wakes up for 30-40 minutes. It is worse when he is fatigued. It is worse in lower light conditions. It is better with far vision and worse with close vision. He has also had to use readers, even without diplopia.  Current MG like symptoms: Ptosis: none Double vision: see above Speech: none Chewing: none Swallowing: none Breathing: none Arm strength: none Leg strength: none  He denies any new numbness or tingling. He has chronic tingling in his right foot after surgery for broken ankle.  Patient mentions MS and MG as possible diagnosis he is worried about.  He had labs through doctors day. He states HbA1c was normal (in 4s) and lipid panel was also normal.   MEDICATIONS:  Outpatient Encounter Medications as of 04/24/2024  Medication Sig   acetaminophen  (TYLENOL ) 500 MG tablet Take 2 tablets  (1,000 mg total) by mouth every 6 (six) hours as needed for mild pain or moderate pain.   fluticasone (FLONASE) 50 MCG/ACT nasal spray Place into both nostrils daily. (Patient taking differently: Place 1 spray into both nostrils as needed.)   ibuprofen (ADVIL) 400 MG tablet Take 400 mg by mouth every 8 (eight) hours as needed.   loratadine (CLARITIN) 10 MG tablet Take 10 mg by mouth daily. (Patient taking differently: Take 10 mg by mouth as needed.)   meloxicam  (MOBIC ) 15 MG tablet Take 1 tablet (15 mg total) by mouth daily as needed for pain (and inflammation). (Patient not taking: Reported on 04/24/2024)   No facility-administered encounter medications on file as of 04/24/2024.    PAST MEDICAL HISTORY: Past Medical History:  Diagnosis Date   Allergy    celiac's and seasonal   Celiac disease    GERD (gastroesophageal reflux disease)     PAST SURGICAL HISTORY: Past Surgical History:  Procedure Laterality Date   APPENDECTOMY     1992   FINGER AMPUTATION  2000   HERNIA REPAIR  2011   b/l inguinal hernia    ORIF ANKLE FRACTURE Right 01/16/2023   Procedure: OPEN REDUCTION INTERNAL FIXATION (ORIF) ANKLE FRACTURE;  Surgeon: Beverley Evalene BIRCH, MD;  Location: [facility];  Service: Orthopedics;  Laterality: Right;   VASECTOMY  2018    ALLERGIES: Allergies  Allergen Reactions   Gluten Meal Diarrhea    FAMILY HISTORY: Family History  Problem Relation Age of Onset   Cancer Mother  thyroid    Hyperlipidemia Father    Polycystic kidney disease Father    Asthma Sister    Depression Sister    Celiac disease Sister    Arthritis Sister    Arnold-Chiari malformation Sister    Depression Sister    Heart disease Paternal Grandfather    Hyperlipidemia Paternal Grandfather    Lung cancer Maternal Grandmother    Prostate cancer Neg Hx    Colon cancer Neg Hx     SOCIAL HISTORY: Social History   Tobacco Use   Smoking status: Never   Smokeless tobacco: Never   Vaping Use   Vaping status: Never Used  Substance Use Topics   Alcohol use: No   Drug use: No   Social History   Social History Narrative   Are you right handed or left handed? Right   Are you currently employed ?    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   Who lives with you? family   What type of home do you live in: 1 story or 2 story? two    No Caffiene    Objective:  Vital Signs:  BP 116/77   Pulse (!) 57   Ht 5' 11 (1.803 m)   Wt 187 lb (84.8 kg)   SpO2 98%   BMI 26.08 kg/m   General: General appearance: Awake and alert. No distress. Cooperative with exam.  Skin: No obvious rash or jaundice. HEENT: Atraumatic. Anicteric. Lungs: Non-labored breathing on room air  Heart: Regular. No carotid bruits. Extremities: No edema. No obvious deformity.  Psych: Affect appropriate.  Neurological: Mental Status: Alert. Speech fluent. No pseudobulbar affect Cranial Nerves: CNII: No RAPD. Visual fields intact. CNIII, IV, VI: PERRL. No nystagmus. Hypertropia of left eye. Has head tilt to right. Diplopia improves with right head tilt, worse with left head tilt. Worse with left upward gaze. CN V: Facial sensation intact bilaterally to fine touch. CN VII: Facial muscles symmetric and strong. No ptosis at rest or after sustained up gazed. CN VIII: Hears finger rub well bilaterally. CN IX: No hypophonia. CN X: Palate elevates symmetrically. CN XI: Full strength shoulder shrug bilaterally. CN XII: Tongue protrusion full and midline. No atrophy or fasciculations. No significant dysarthria Motor: Tone is normal. Strength is 5/5 in bilateral upper and lower extremities Reflexes:  Right Left   Bicep 2+ 2+   Tricep 2+ 2+   BrRad 2+ 2+   Knee 2+ 2+   Ankle 2+ 2+    Sensation: Intact to light touch in all extremities Coordination: Intact finger-to- nose-finger bilaterally. Romberg negative. Gait: Able to rise from chair with arms crossed  unassisted. Normal, narrow-based gait.   Labs and Imaging review: Internal labs: 09/30/20: TSH wnl HbA1c: 5.4 Lipid panel: LDL 84  Imaging/Procedures: MRI brain wo contrast (04/08/24): Brain: Diffusion imaging does not show any acute or subacute infarction or other cause of restricted diffusion. No focal abnormality affects the brainstem or cerebellum. Perimesencephalic cistern regions appear normal. No abnormality seen affecting the cranial nerves or cavernous sinus region. Cerebral hemispheres are normal without stroke, mass, hemorrhage, hydrocephalus or extra-axial collection. No sign of demyelinating disease.   Vascular: Major vessels at the base of the brain show flow.   Skull and upper cervical spine: Negative   Sinuses/Orbits: Complete opacification of the right maxillary sinus consistent with rhinosinusitis. Other sinuses are clear. Orbits negative. No gross dysconjugate gaze.   Other: None   IMPRESSION: 1. Normal appearance  of the brain itself. 2. Complete opacification of the right maxillary sinus consistent with rhinosinusitis. 3. No abnormality seen to explain diplopia.  Assessment/Plan:  Dean Cross is a 43 y.o. male who presents for evaluation of diplopia. He has a relevant medical history of celiac disease, GERD. His neurological examination is pertinent for diplopia worse with left head tilt, improved with right head tilt and with left eye hypertrophia at rest. Exam was otherwise normal. Available diagnostic data is significant for MRI brain that was unremarkable. He had a lipid panel and HbA1c earlier this year (through doctors' day that I cannot see) that was normal. This constellation of symptoms and objective data would most likely localize to left superior oblique palsy (CN IV). There is no clear evidence of myasthenia gravis, but this is also on the differential. The most likely cause is an ischemic CN IV palsy, but he has no known risk factors. I will  work up as below. We discussed natural history of ischemic CN IV palsy and that symptoms should improve at least some in the coming months. Prisms can help.  PLAN: -Blood work: myasthenia gravis panel, TSH, B12, B1 -Will monitor symptoms if above is negative  -Return to clinic in about 3 months  The impression above as well as the plan as outlined below were extensively discussed with the patient who voiced understanding. All questions were answered to their satisfaction.  When available, results of the above investigations and possible further recommendations will be communicated to the patient via telephone/MyChart. Patient to call office if not contacted after expected testing turnaround time.   Total time spent reviewing records, interview, history/exam, documentation, and coordination of care on day of encounter:  50 min   Thank you for allowing me to participate in patient's care.  If I can answer any additional questions, I would be pleased to do so.  Venetia Potters, MD   CC: Kennyth Worth HERO, MD [address] [place] [address]  CC: Referring provider: Kennyth Worth HERO, MD [address] [place],  [place] [address]

## 2024-04-24 ENCOUNTER — Ambulatory Visit (INDEPENDENT_AMBULATORY_CARE_PROVIDER_SITE_OTHER): Admitting: Neurology

## 2024-04-24 ENCOUNTER — Encounter: Payer: Self-pay | Admitting: Neurology

## 2024-04-24 ENCOUNTER — Other Ambulatory Visit

## 2024-04-24 VITALS — BP 116/77 | HR 57 | Ht 71.0 in | Wt 187.0 lb

## 2024-04-24 DIAGNOSIS — H4912 Fourth [trochlear] nerve palsy, left eye: Secondary | ICD-10-CM | POA: Diagnosis not present

## 2024-04-24 DIAGNOSIS — H532 Diplopia: Secondary | ICD-10-CM | POA: Diagnosis not present

## 2024-04-24 NOTE — Patient Instructions (Signed)
 I saw you today for double vision. This looks most like a left CN IV palsy. If that is the case, the most common cause is local ischemia. You do not have traditional risk factors, so I want to investigate further: -Blood work today: AChR abs for myasthenia gravis, B1, B12, and TSH  Your MRI brain looked good without clear cause of symptoms and certainly not consistent with MS.  I will be in touch when I have your labs. If they are normal, I recommend continuing the prisms and monitor for improvement in the coming months.  I would like to put you back on my clinic schedule for about 3 months in case symptoms do not resolve. Please let me know if you have any questions or concerns in the meantime.  The physicians and staff at [facility] are committed to providing excellent care. You may receive a survey requesting feedback about your experience at our office. We strive to receive very good responses to the survey questions. If you feel that your experience would prevent you from giving the office a very good  response, please contact our office to try to remedy the situation. We may be reached at [phone]. Thank you for taking the time out of your busy day to complete the survey.  Venetia Potters, MD [facility]

## 2024-05-03 LAB — VITAMIN B12: Vitamin B-12: 459 pg/mL (ref 200–1100)

## 2024-05-03 LAB — MYASTHENIA GRAVIS PANEL 2
A CHR BINDING ABS: 0.47 nmol/L
ACHR Blocking Abs: <15 %{inhibition} (ref <15)
Acetylchol Modul Ab: <1 %{inhibition}

## 2024-05-03 LAB — VITAMIN B1: Vitamin B1 (Thiamine): 13 nmol/L (ref 8–30)

## 2024-05-03 LAB — TSH: TSH: 1.25 m[IU]/L (ref 0.40–4.50)

## 2024-05-05 ENCOUNTER — Encounter: Payer: Self-pay | Admitting: Neurology

## 2024-08-14 ENCOUNTER — Ambulatory Visit: Admitting: Neurology

## 2024-12-30 ENCOUNTER — Encounter: Admitting: Family Medicine
